# Patient Record
Sex: Male | Born: 1970 | Race: White | Hispanic: No | Marital: Single | Smoking: Current every day smoker
Health system: Southern US, Community
[De-identification: ages and names within clinical notes are randomized; demographics above are authoritative.]

## PROBLEM LIST (undated history)

## (undated) ENCOUNTER — Emergency Department (HOSPITAL_COMMUNITY): Admission: EM | Payer: Self-pay | Source: Home / Self Care

## (undated) DIAGNOSIS — I10 Essential (primary) hypertension: Secondary | ICD-10-CM

## (undated) HISTORY — PX: TEE WITHOUT CARDIOVERSION: SHX5443

---

## 2006-12-25 ENCOUNTER — Emergency Department: Payer: Self-pay | Admitting: Emergency Medicine

## 2010-02-15 ENCOUNTER — Emergency Department (HOSPITAL_COMMUNITY): Admission: EM | Admit: 2010-02-15 | Discharge: 2010-02-15 | Payer: Self-pay | Admitting: Emergency Medicine

## 2017-12-13 ENCOUNTER — Emergency Department (HOSPITAL_COMMUNITY)
Admission: EM | Admit: 2017-12-13 | Discharge: 2017-12-13 | Discharge status: Against Medical Advice | Payer: Self-pay | Attending: Emergency Medicine | Admitting: Emergency Medicine

## 2017-12-13 ENCOUNTER — Encounter (HOSPITAL_COMMUNITY): Payer: Self-pay

## 2017-12-13 ENCOUNTER — Other Ambulatory Visit: Payer: Self-pay

## 2017-12-13 ENCOUNTER — Emergency Department (HOSPITAL_COMMUNITY): Payer: Self-pay

## 2017-12-13 DIAGNOSIS — R0602 Shortness of breath: Secondary | ICD-10-CM | POA: Insufficient documentation

## 2017-12-13 DIAGNOSIS — Z5321 Procedure and treatment not carried out due to patient leaving prior to being seen by health care provider: Secondary | ICD-10-CM | POA: Insufficient documentation

## 2017-12-13 LAB — CBC
HCT: 48.8 % (ref 39.0–52.0)
Hemoglobin: 15.9 g/dL (ref 13.0–17.0)
MCH: 30.6 pg (ref 26.0–34.0)
MCHC: 32.6 g/dL (ref 30.0–36.0)
MCV: 93.8 fL (ref 78.0–100.0)
PLATELETS: 235 10*3/uL (ref 150–400)
RBC: 5.2 MIL/uL (ref 4.22–5.81)
RDW: 12.5 % (ref 11.5–15.5)
WBC: 8.4 10*3/uL (ref 4.0–10.5)

## 2017-12-13 LAB — BASIC METABOLIC PANEL
Anion gap: 12 (ref 5–15)
BUN: 20 mg/dL (ref 6–20)
CALCIUM: 9.1 mg/dL (ref 8.9–10.3)
CO2: 24 mmol/L (ref 22–32)
Chloride: 101 mmol/L (ref 98–111)
Creatinine, Ser: 1.06 mg/dL (ref 0.61–1.24)
GFR calc non Af Amer: >60 mL/min (ref >60)
Glucose, Bld: 119 mg/dL — ABNORMAL HIGH (ref 70–99)
Potassium: 4.1 mmol/L (ref 3.5–5.1)
Sodium: 137 mmol/L (ref 135–145)

## 2017-12-13 NOTE — ED Notes (Signed)
No answer in lobby.

## 2017-12-13 NOTE — ED Triage Notes (Signed)
Pt here for being short of breath, getting progressively worse over the last 3 days.  Stopped smoking 3 days ago as well. Productive cough for last 3 days.  Family hx of COPD.  States hard to take a deep breath.

## 2017-12-14 ENCOUNTER — Encounter (HOSPITAL_COMMUNITY): Payer: Self-pay | Admitting: Emergency Medicine

## 2017-12-14 ENCOUNTER — Other Ambulatory Visit: Payer: Self-pay

## 2017-12-14 ENCOUNTER — Emergency Department (HOSPITAL_COMMUNITY): Payer: Self-pay

## 2017-12-14 ENCOUNTER — Emergency Department (HOSPITAL_COMMUNITY)
Admission: EM | Admit: 2017-12-14 | Discharge: 2017-12-15 | Disposition: A | Discharge status: Home / Self Care | Payer: Self-pay | Attending: Emergency Medicine | Admitting: Emergency Medicine

## 2017-12-14 DIAGNOSIS — J209 Acute bronchitis, unspecified: Secondary | ICD-10-CM | POA: Insufficient documentation

## 2017-12-14 DIAGNOSIS — Z87891 Personal history of nicotine dependence: Secondary | ICD-10-CM | POA: Insufficient documentation

## 2017-12-14 LAB — CBC
HEMATOCRIT: 49.8 % (ref 39.0–52.0)
Hemoglobin: 16.4 g/dL (ref 13.0–17.0)
MCH: 30.5 pg (ref 26.0–34.0)
MCHC: 32.9 g/dL (ref 30.0–36.0)
MCV: 92.7 fL (ref 78.0–100.0)
PLATELETS: 237 10*3/uL (ref 150–400)
RBC: 5.37 MIL/uL (ref 4.22–5.81)
RDW: 12.5 % (ref 11.5–15.5)
WBC: 6.7 10*3/uL (ref 4.0–10.5)

## 2017-12-14 MED ORDER — ALBUTEROL SULFATE (2.5 MG/3ML) 0.083% IN NEBU
5.0000 mg | INHALATION_SOLUTION | Freq: Once | RESPIRATORY_TRACT | Status: AC
  Administered 2017-12-14: 5 mg via RESPIRATORY_TRACT
  Filled 2017-12-14: qty 6

## 2017-12-14 NOTE — ED Triage Notes (Signed)
Pt reports shortness of breath since last Friday. Pt was here on Monday but LWBS. Pt reports last night he passed out twice and hit the side of the face, no obvious injury.

## 2017-12-15 ENCOUNTER — Emergency Department (HOSPITAL_COMMUNITY): Payer: Self-pay

## 2017-12-15 LAB — BASIC METABOLIC PANEL
Anion gap: 10 (ref 5–15)
BUN: 21 mg/dL — ABNORMAL HIGH (ref 6–20)
CALCIUM: 9.1 mg/dL (ref 8.9–10.3)
CO2: 25 mmol/L (ref 22–32)
Chloride: 103 mmol/L (ref 98–111)
Creatinine, Ser: 1.21 mg/dL (ref 0.61–1.24)
GFR calc non Af Amer: >60 mL/min (ref >60)
Glucose, Bld: 133 mg/dL — ABNORMAL HIGH (ref 70–99)
Potassium: 4.4 mmol/L (ref 3.5–5.1)
SODIUM: 138 mmol/L (ref 135–145)

## 2017-12-15 LAB — URINALYSIS, ROUTINE W REFLEX MICROSCOPIC
BILIRUBIN URINE: NEGATIVE
Glucose, UA: NEGATIVE mg/dL
HGB URINE DIPSTICK: NEGATIVE
KETONES UR: NEGATIVE mg/dL
Leukocytes, UA: NEGATIVE
NITRITE: NEGATIVE
PH: 5 (ref 5.0–8.0)
Protein, ur: NEGATIVE mg/dL
Specific Gravity, Urine: 1.021 (ref 1.005–1.030)

## 2017-12-15 MED ORDER — IPRATROPIUM-ALBUTEROL 0.5-2.5 (3) MG/3ML IN SOLN
3.0000 mL | Freq: Once | RESPIRATORY_TRACT | Status: AC
  Administered 2017-12-15: 3 mL via RESPIRATORY_TRACT
  Filled 2017-12-15: qty 3

## 2017-12-15 MED ORDER — ALBUTEROL SULFATE HFA 108 (90 BASE) MCG/ACT IN AERS
2.0000 | INHALATION_SPRAY | RESPIRATORY_TRACT | Status: DC | PRN
  Administered 2017-12-15: 2 via RESPIRATORY_TRACT
  Filled 2017-12-15: qty 6.7

## 2017-12-15 MED ORDER — IOPAMIDOL (ISOVUE-370) INJECTION 76%
100.0000 mL | Freq: Once | INTRAVENOUS | Status: AC | PRN
  Administered 2017-12-15: 100 mL via INTRAVENOUS

## 2017-12-15 MED ORDER — PREDNISONE 20 MG PO TABS: 60.0000 mg | ORAL_TABLET | Freq: Every day | ORAL | 0 refills | Status: DC

## 2017-12-15 MED ORDER — IOPAMIDOL (ISOVUE-370) INJECTION 76%
INTRAVENOUS | Status: AC
  Filled 2017-12-15: qty 100

## 2017-12-15 MED ORDER — METHYLPREDNISOLONE SODIUM SUCC 125 MG IJ SOLR
125.0000 mg | Freq: Once | INTRAMUSCULAR | Status: AC
  Administered 2017-12-15: 125 mg via INTRAVENOUS
  Filled 2017-12-15: qty 2

## 2017-12-15 MED ORDER — AMOXICILLIN 500 MG PO CAPS: 1000.0000 mg | ORAL_CAPSULE | Freq: Two times a day (BID) | ORAL | 0 refills | Status: DC

## 2017-12-15 NOTE — ED Notes (Signed)
Patient transported to CT 

## 2017-12-15 NOTE — ED Notes (Signed)
Vss, pt verbalized understanding of dc instructions, resp e/u, nad.

## 2017-12-15 NOTE — ED Provider Notes (Signed)
[facility] EMERGENCY DEPARTMENT Provider Note   CSN: [id number] Arrival date & time: 12/14/17  2241     History   Chief Complaint Chief Complaint  Patient presents with  . Shortness of Breath  . Loss of Consciousness    HPI Alfred Michael is a 47 y.o. male.  Patient presents to the emergency department for evaluation of syncope and shortness of breath.  Patient reports that he has been experiencing shortness of breath for a week.  Symptoms have progressively worsened.  He reports that he quit smoking around that time.  He has been coughing excessively.  Today he blacked out twice.  He reports that with 1 of the episodes he actually fell to the ground.  No injury noted.  He reports that he still feels short of breath and it hurts when he breathes, cannot take a deep breath.     History reviewed. No pertinent past medical history.  There are no active problems to display for this patient.   History reviewed. No pertinent surgical history.      Home Medications    Prior to Admission medications   Medication Sig Start Date End Date Taking? Authorizing Provider  amoxicillin (AMOXIL) 500 MG capsule Take 2 capsules (1,000 mg total) by mouth 2 (two) times daily. 12/15/17   Gilda Crease, MD  predniSONE (DELTASONE) 20 MG tablet Take 3 tablets (60 mg total) by mouth daily with breakfast. 12/15/17   Ziana Heyliger, Canary Brim, MD    Family History History reviewed. No pertinent family history.  Social History Social History   Tobacco Use  . Smoking status: Former Smoker    Last attempt to quit: 12/11/2017    Years since quitting: 0.0  Substance Use Topics  . Alcohol use: Yes  . Drug use: Never     Allergies   Patient has no known allergies.   Review of Systems Review of Systems  Respiratory: Positive for cough and shortness of breath.   Neurological: Positive for syncope.  All other systems reviewed and are negative.    Physical  Exam Updated Vital Signs BP (!) 147/93   Pulse 81   Temp 97.9 F (36.6 C) (Oral)   Resp 20   SpO2 94%   Physical Exam  Constitutional: He is oriented to person, place, and time. He appears well-developed and well-nourished. No distress.  HENT:  Head: Normocephalic and atraumatic.  Right Ear: Hearing normal.  Left Ear: Hearing normal.  Nose: Nose normal.  Mouth/Throat: Oropharynx is clear and moist and mucous membranes are normal.  Eyes: Pupils are equal, round, and reactive to light. Conjunctivae and EOM are normal.  Neck: Normal range of motion. Neck supple.  Cardiovascular: Regular rhythm, S1 normal and S2 normal. Exam reveals no gallop and no friction rub.  No murmur heard. Pulmonary/Chest: Effort normal. No respiratory distress. He has decreased breath sounds. He has wheezes. He exhibits no tenderness.  Abdominal: Soft. Normal appearance and bowel sounds are normal. There is no hepatosplenomegaly. There is no tenderness. There is no rebound, no guarding, no tenderness at McBurney's point and negative Murphy's sign. No hernia.  Musculoskeletal: Normal range of motion.  Neurological: He is alert and oriented to person, place, and time. He has normal strength. No cranial nerve deficit or sensory deficit. Coordination normal. GCS eye subscore is 4. GCS verbal subscore is 5. GCS motor subscore is 6.  Skin: Skin is warm, dry and intact. No rash noted. No cyanosis.  Psychiatric: He has  a normal mood and affect. His speech is normal and behavior is normal. Thought content normal.  Nursing note and vitals reviewed.    ED Treatments / Results  Labs (all labs ordered are listed, but only abnormal results are displayed) Labs Reviewed  BASIC METABOLIC PANEL - Abnormal; Notable for the following components:      Result Value   Glucose, Bld 133 (*)    BUN 21 (*)    All other components within normal limits  CBC  URINALYSIS, ROUTINE W REFLEX MICROSCOPIC    EKG EKG  Interpretation  Date/Time:  Friday December 14 2017 22:55:22 EDT Ventricular Rate:  91 PR Interval:  120 QRS Duration: 88 QT Interval:  340 QTC Calculation: 418 R Axis:   88 Text Interpretation:  Normal sinus rhythm Normal ECG Confirmed by Gilda Crease ([id number]) on 12/15/2017 2:19:33 AM   Radiology Dg Chest 2 View  Result Date: 12/14/2017 CLINICAL DATA:  Shortness of breath EXAM: CHEST - 2 VIEW COMPARISON:  12/13/2017 FINDINGS: No focal airspace disease or pleural effusion. Normal heart size. No pneumothorax. IMPRESSION: No active cardiopulmonary disease. Electronically Signed   By: Jasmine Pang M.D.   On: 12/14/2017 23:49   Ct Angio Chest Pe W Or Wo Contrast  Result Date: 12/15/2017 CLINICAL DATA:  Shortness of breath. Syncope. PE suspected, high pretest prob EXAM: CT ANGIOGRAPHY CHEST WITH CONTRAST TECHNIQUE: Multidetector CT imaging of the chest was performed using the standard protocol during bolus administration of intravenous contrast. Multiplanar CT image reconstructions and MIPs were obtained to evaluate the vascular anatomy. CONTRAST:  ISOVUE-370 IOPAMIDOL (ISOVUE-370) INJECTION 76% COMPARISON:  Chest radiograph yesterday. FINDINGS: Cardiovascular: There are no filling defects within the pulmonary arteries to suggest pulmonary embolus. The thoracic aorta is normal in caliber without dissection. Heart is normal in size. No pericardial effusion. Mediastinum/Nodes: Shotty bilateral hilar nodes measuring 9 mm on the right and 8 mm on the left. No enlarged mediastinal nodes. Visualized thyroid gland is normal. Esophagus is nondistended. Lungs/Pleura: Moderate bronchial thickening with areas of mucous plugging in the right middle and bilateral lower lobes. Minimal dependent atelectasis. No confluent airspace disease, pulmonary edema or pleural fluid. No pulmonary nodule or mass. Upper Abdomen: No acute abnormality. Musculoskeletal: There are no acute or suspicious osseous  abnormalities. Multilevel degenerative change in the thoracic spine. Remote left lateral rib fractures. Review of the MIP images confirms the above findings. IMPRESSION: 1. No pulmonary embolus. 2. Central bronchial thickening with areas of mucous plugging in the right middle and bilateral lower lobes consistent with bronchitis or asthma. Shotty mediastinal lymph nodes are likely reactive. Electronically Signed   By: Narda Rutherford M.D.   On: 12/15/2017 04:18    Procedures Procedures (including critical care time)  Medications Ordered in ED Medications  iopamidol (ISOVUE-370) 76 % injection (has no administration in time range)  albuterol (PROVENTIL HFA;VENTOLIN HFA) 108 (90 Base) MCG/ACT inhaler 2 puff (has no administration in time range)  albuterol (PROVENTIL) (2.5 MG/3ML) 0.083% nebulizer solution 5 mg (5 mg Nebulization Given 12/14/17 2259)  methylPREDNISolone sodium succinate (SOLU-MEDROL) 125 mg/2 mL injection 125 mg (125 mg Intravenous Given 12/15/17 0258)  ipratropium-albuterol (DUONEB) 0.5-2.5 (3) MG/3ML nebulizer solution 3 mL (3 mLs Nebulization Given 12/15/17 0258)  iopamidol (ISOVUE-370) 76 % injection 100 mL (100 mLs Intravenous Contrast Given 12/15/17 0330)     Initial Impression / Assessment and Plan / ED Course  I have reviewed the triage vital signs and the nursing notes.  Pertinent labs & imaging  results that were available during my care of the patient were reviewed by me and considered in my medical decision making (see chart for details).     Patient presents to the emergency department for evaluation of cough, chest congestion.  He also has had a syncopal episode here today.  Patient reports that symptoms began after he quit smoking.  He has had cough and congestion.  Cough is productive of thick sputum.  He had some wheezing here in the ER, improved with nebulizer treatment.  CT angiography did not show evidence of PE or pneumonia, patient likely has significant  bronchospasm and bronchitis, will treat.  Final Clinical Impressions(s) / ED Diagnoses   Final diagnoses:  Acute bronchitis, unspecified organism    ED Discharge Orders         Ordered    amoxicillin (AMOXIL) 500 MG capsule  2 times daily     12/15/17 0634    predniSONE (DELTASONE) 20 MG tablet  Daily with breakfast     12/15/17 0634           Gilda CreasePollina, Christepher Melchior J, MD 12/15/17 [phone]

## 2019-05-25 IMAGING — CR DG CHEST 2V
2 series · 2 of 2 positions shown · non-contrast
Comparison: None.

CLINICAL DATA: Shortness of breath.

EXAM:
CHEST - 2 VIEW

[chest pa]
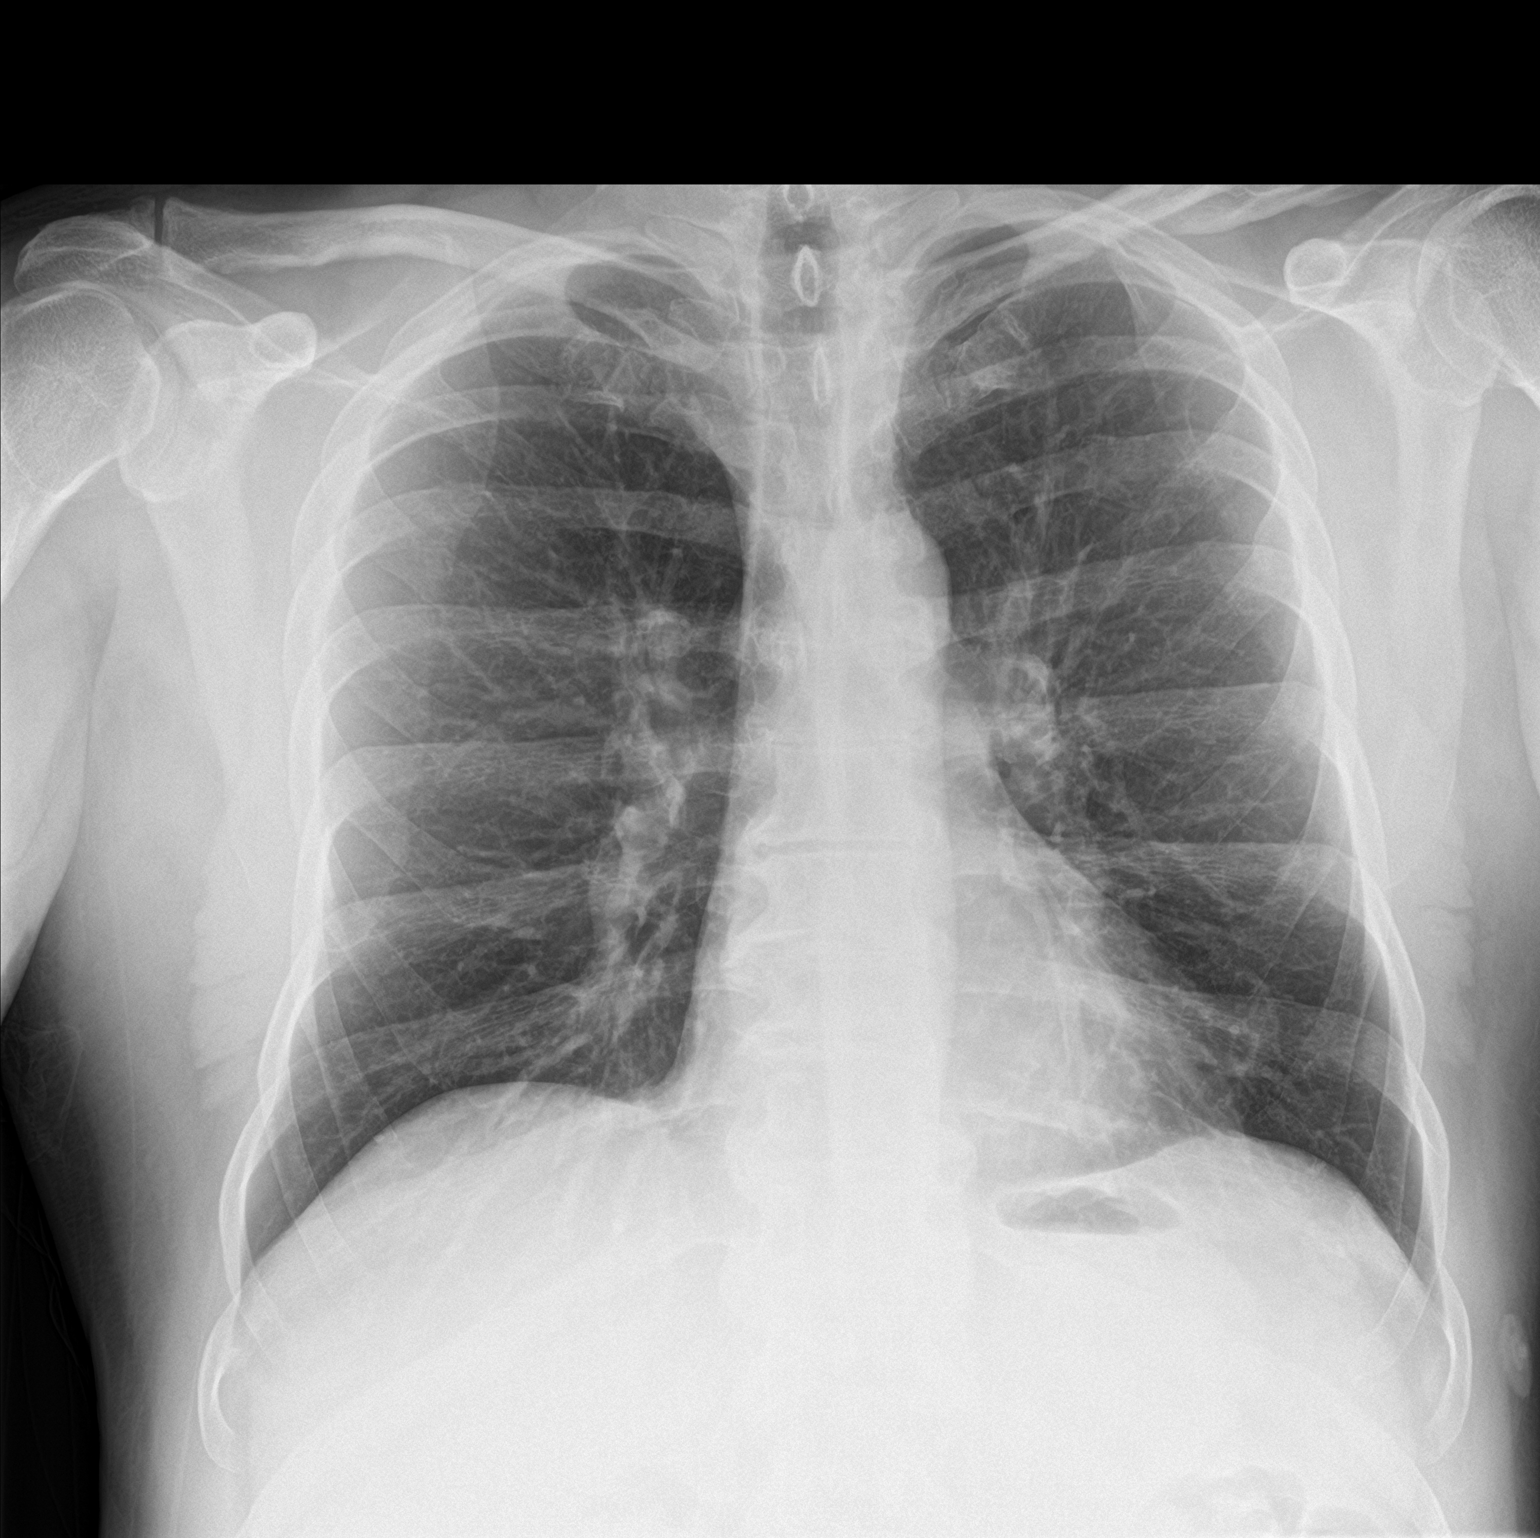

[chest lat]
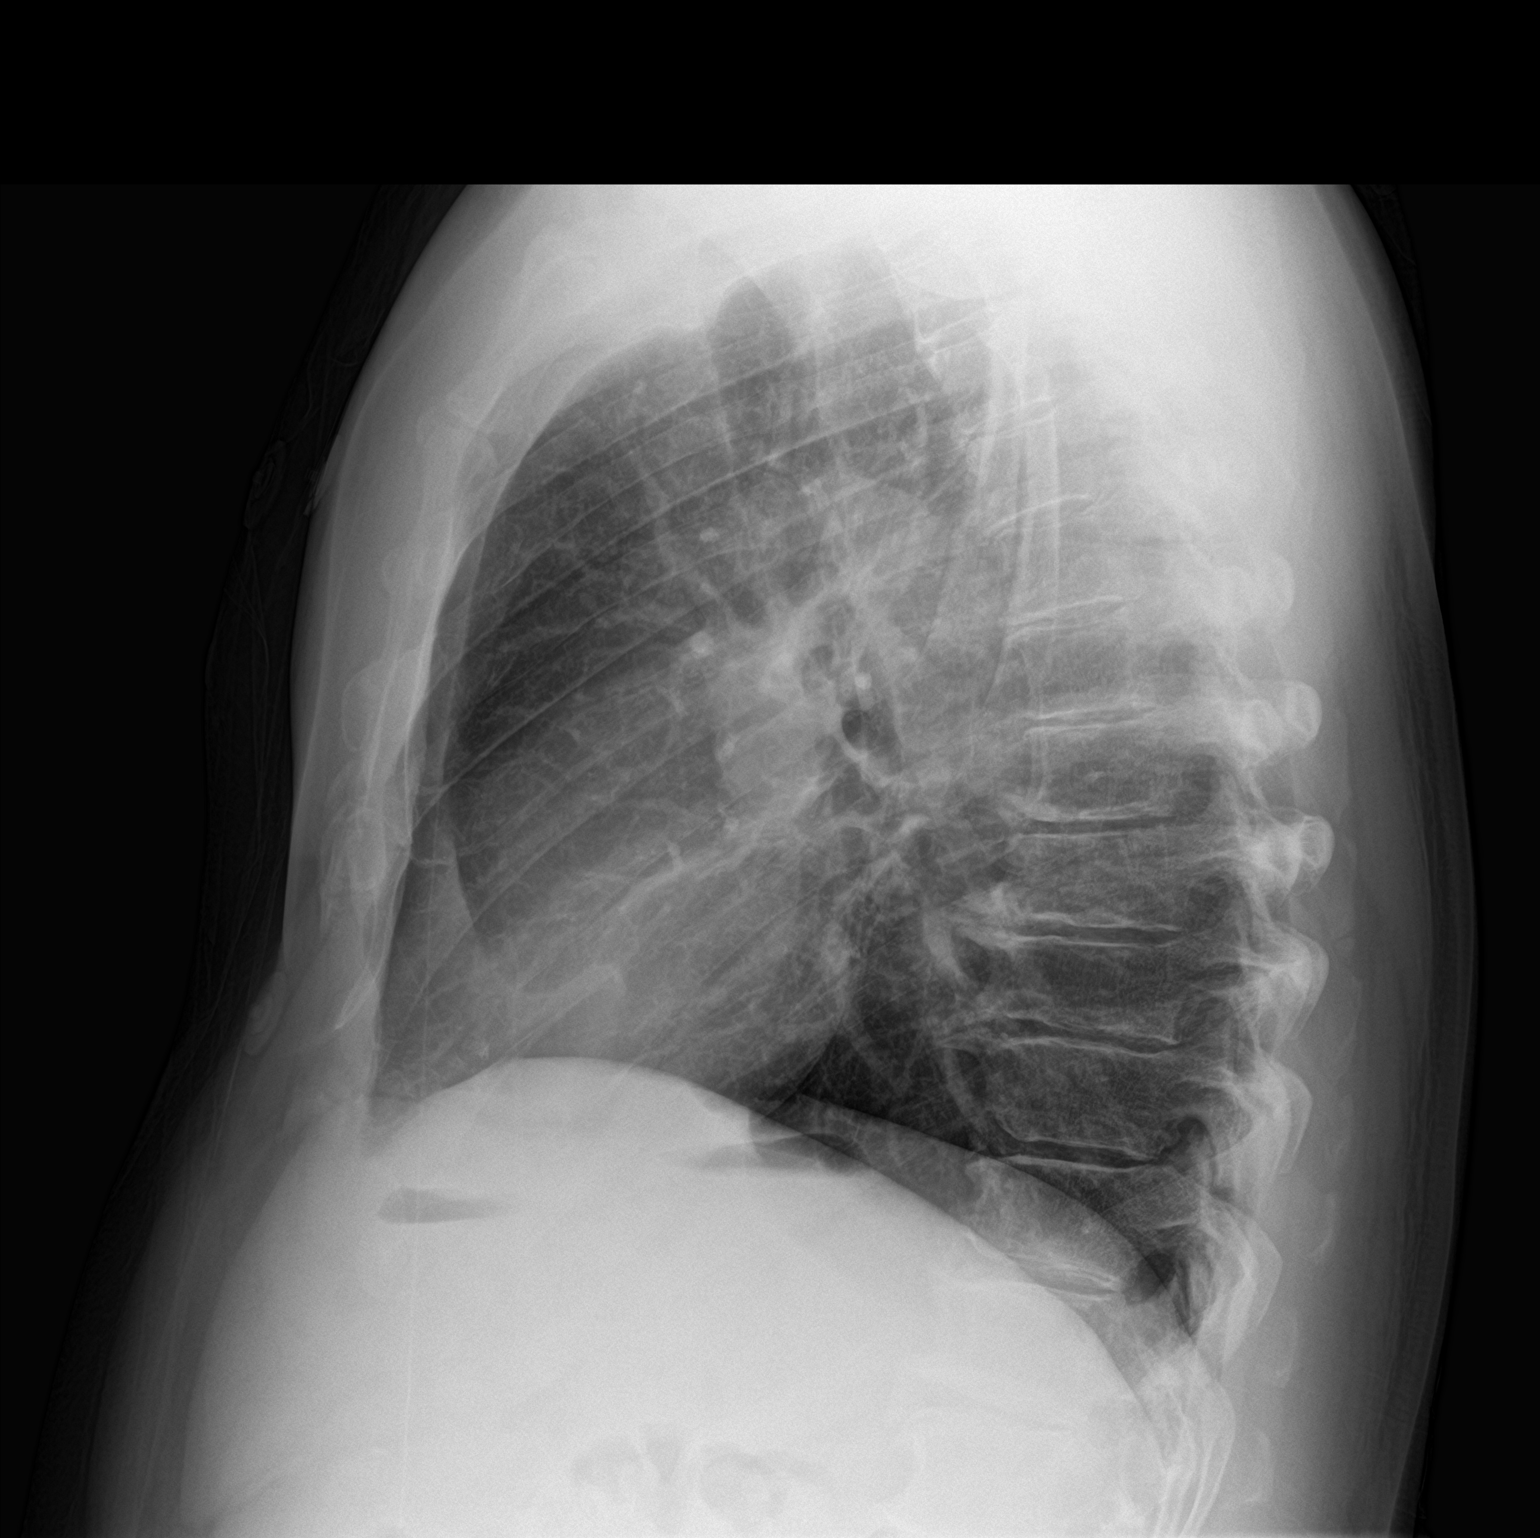

[2 of 2 positions shown; findings below may reference images not displayed]

FINDINGS: The cardiomediastinal contours are normal. The lungs are clear.
Pulmonary vasculature is normal. No consolidation, pleural effusion,
or pneumothorax. No acute osseous abnormalities are seen.
Degenerative change in the spine.
IMPRESSION: No acute findings.

## 2019-05-26 ENCOUNTER — Ambulatory Visit: Payer: Self-pay

## 2019-05-26 IMAGING — CR DG CHEST 2V
2 series · 2 of 2 positions shown · non-contrast
Comparison: 12/13/2017

CLINICAL DATA: Shortness of breath

EXAM:
CHEST - 2 VIEW

[chest pa]
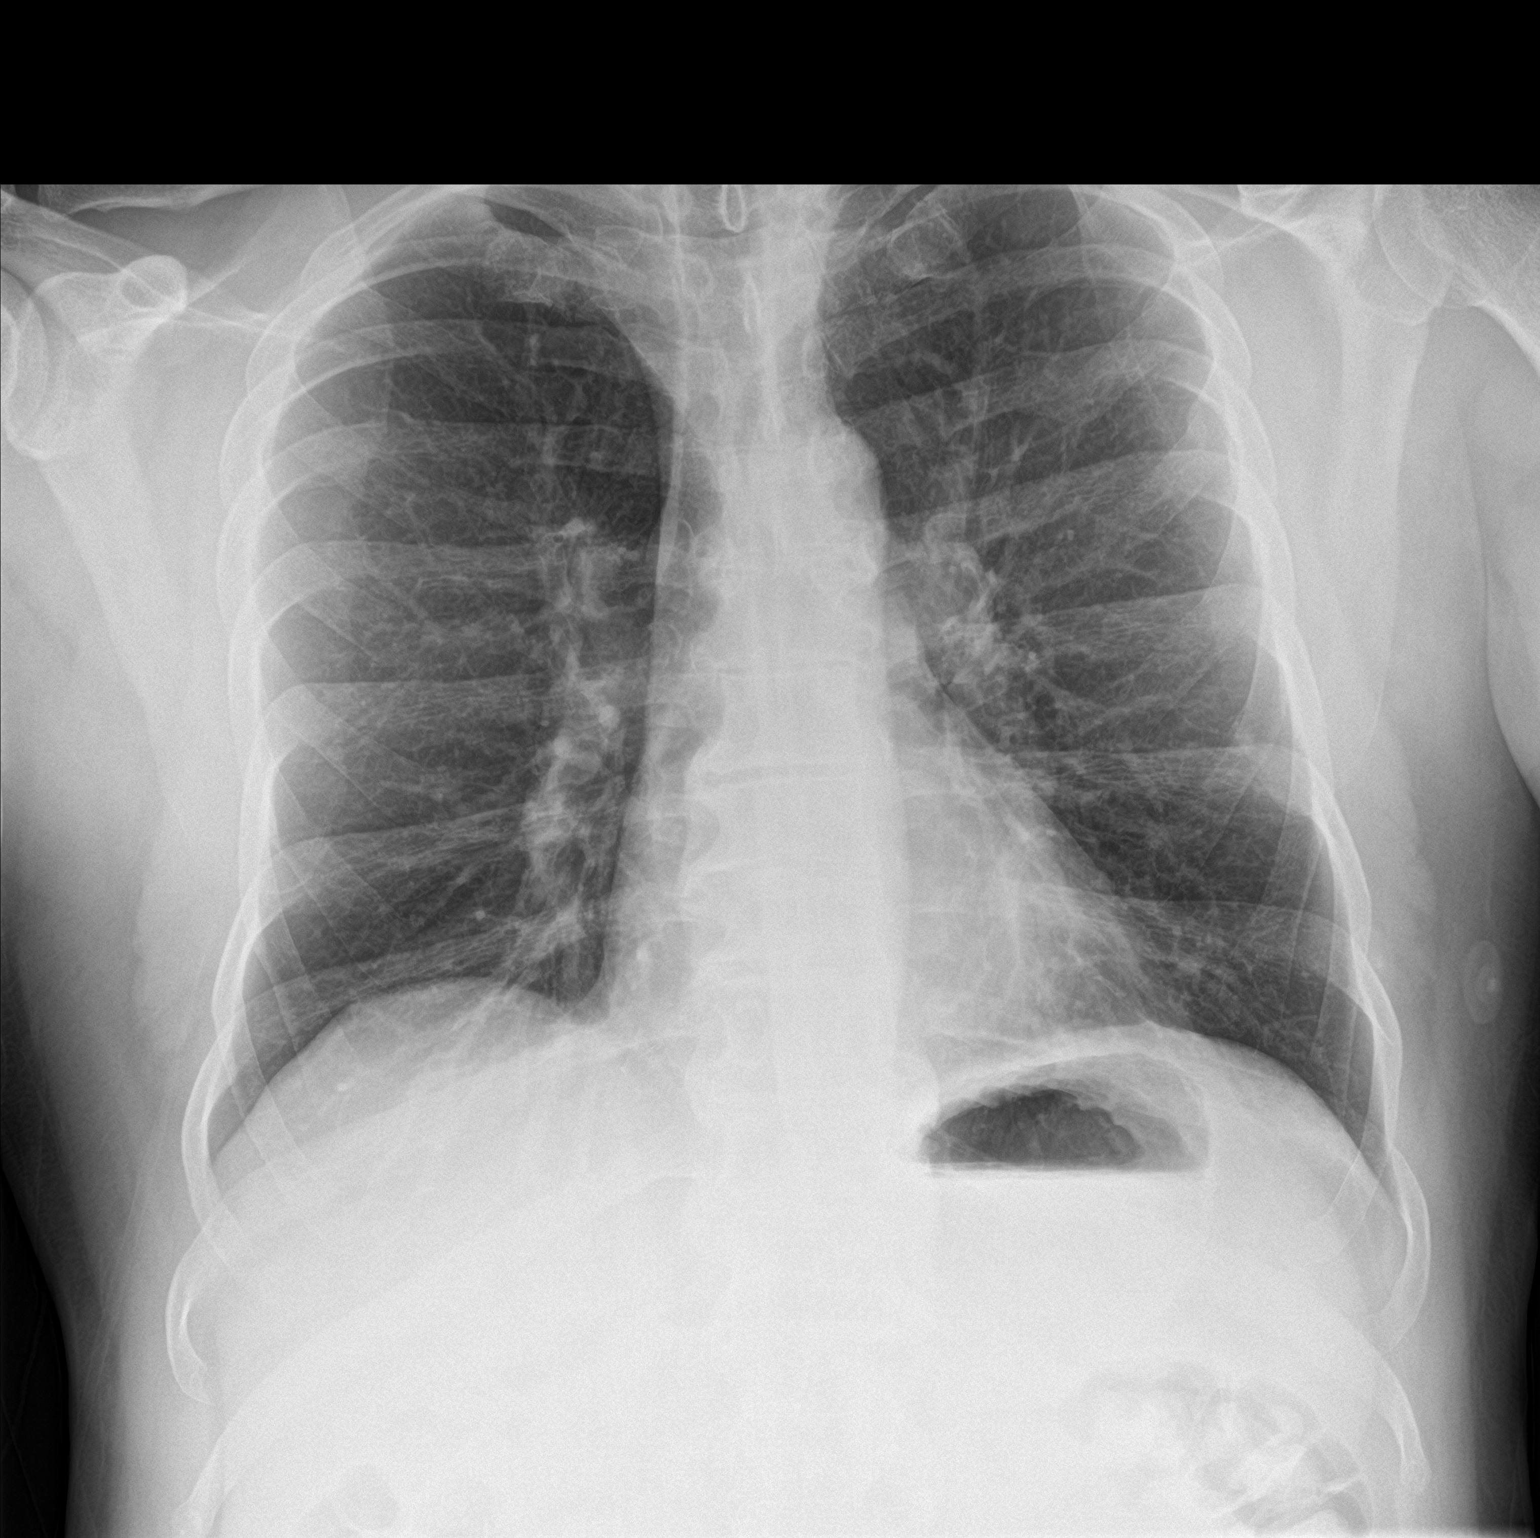

[chest lat]
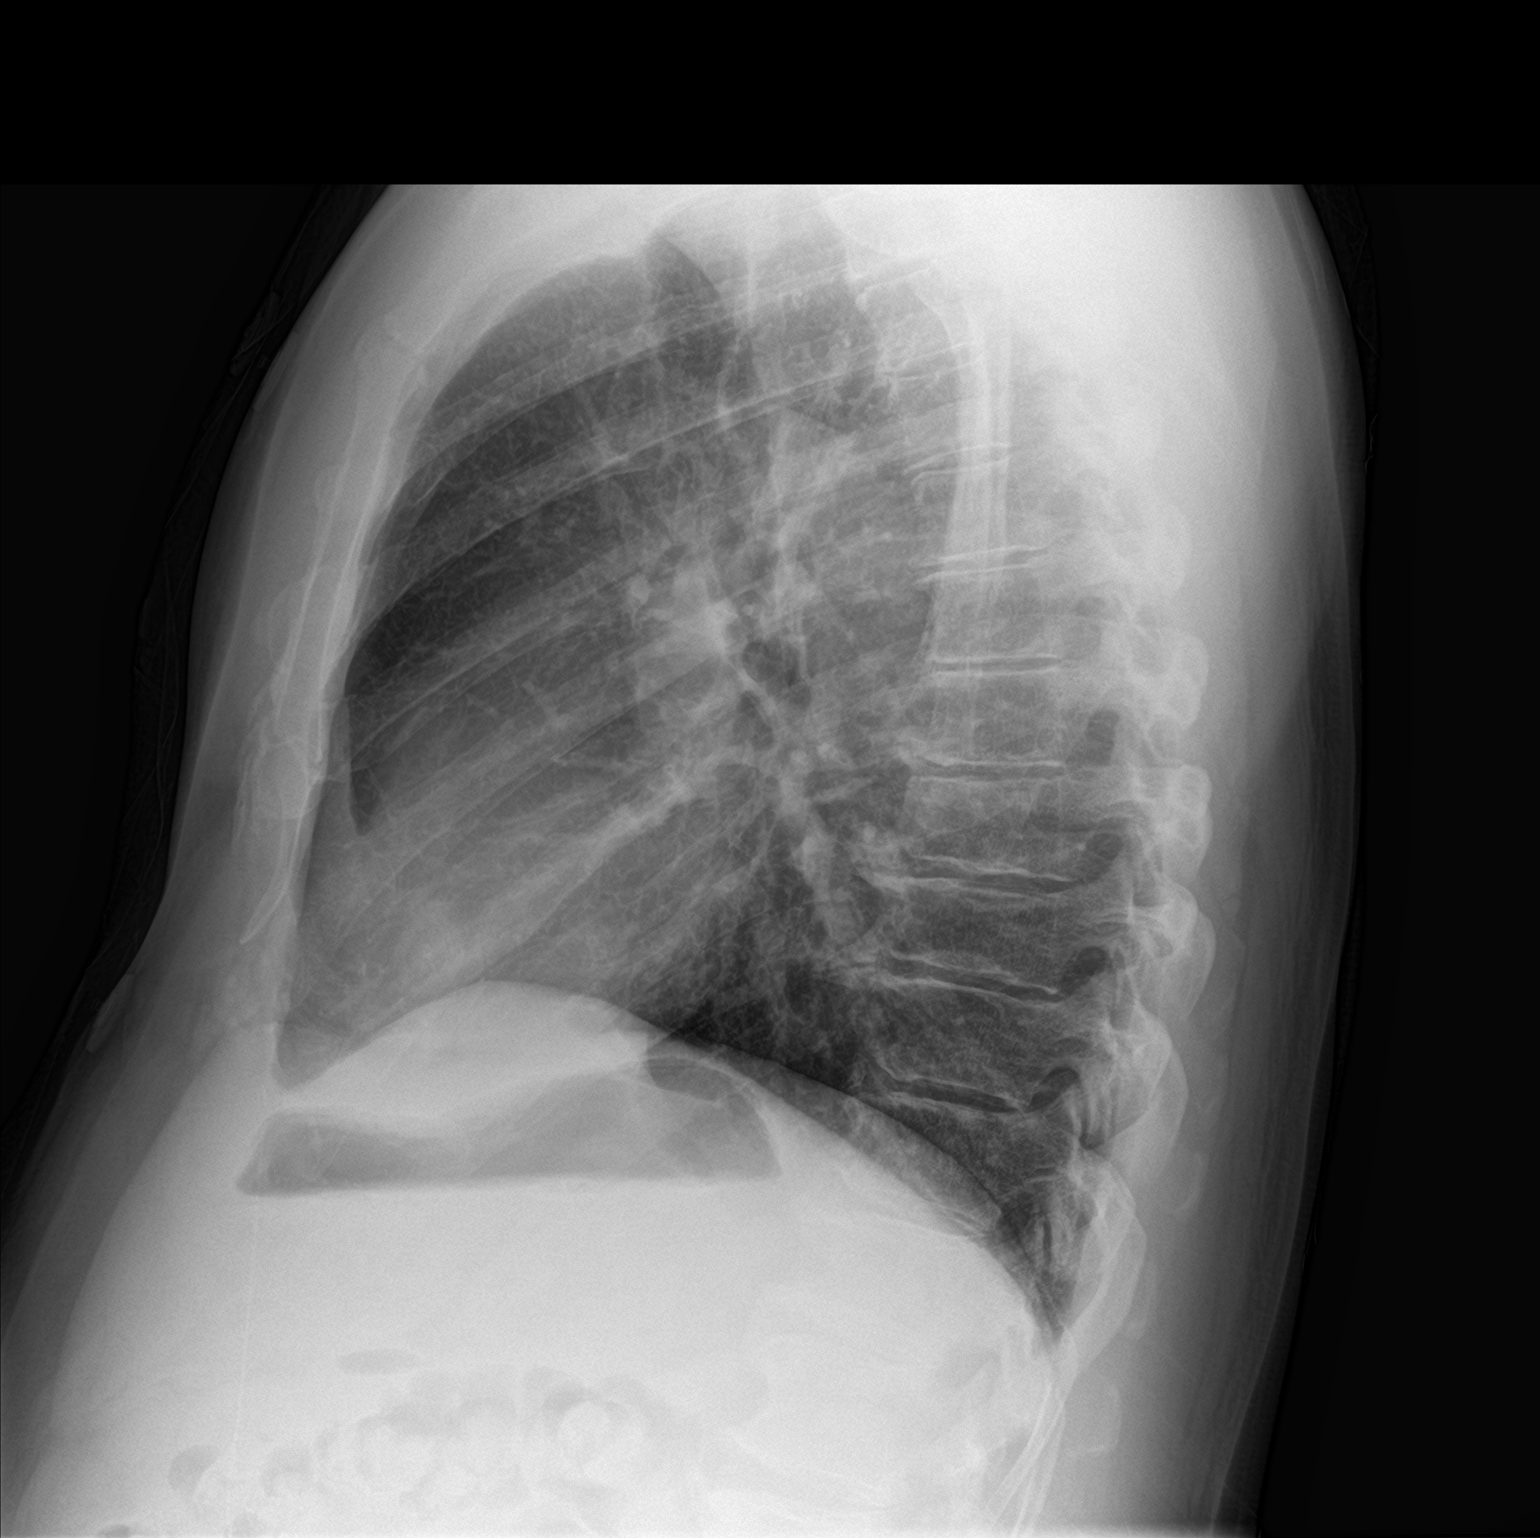

[2 of 2 positions shown; findings below may reference images not displayed]

FINDINGS: No focal airspace disease or pleural effusion. Normal heart size. No
pneumothorax.
IMPRESSION: No active cardiopulmonary disease.

## 2021-10-19 ENCOUNTER — Other Ambulatory Visit: Payer: Self-pay

## 2021-10-19 ENCOUNTER — Emergency Department (HOSPITAL_COMMUNITY)
Admission: EM | Admit: 2021-10-19 | Discharge: 2021-10-19 | Disposition: A | Discharge status: Home / Self Care | Payer: Self-pay | Attending: Emergency Medicine | Admitting: Emergency Medicine

## 2021-10-19 ENCOUNTER — Encounter (HOSPITAL_COMMUNITY): Payer: Self-pay | Admitting: Emergency Medicine

## 2021-10-19 DIAGNOSIS — L03116 Cellulitis of left lower limb: Secondary | ICD-10-CM | POA: Insufficient documentation

## 2021-10-19 DIAGNOSIS — L03115 Cellulitis of right lower limb: Secondary | ICD-10-CM | POA: Insufficient documentation

## 2021-10-19 DIAGNOSIS — L03119 Cellulitis of unspecified part of limb: Secondary | ICD-10-CM

## 2021-10-19 LAB — BASIC METABOLIC PANEL
Anion gap: 7 (ref 5–15)
BUN: 27 mg/dL — ABNORMAL HIGH (ref 6–20)
CO2: 31 mmol/L (ref 22–32)
Calcium: 8.7 mg/dL — ABNORMAL LOW (ref 8.9–10.3)
Chloride: 99 mmol/L (ref 98–111)
Creatinine, Ser: 1.1 mg/dL (ref 0.61–1.24)
GFR, Estimated: >60 mL/min (ref >60)
Glucose, Bld: 91 mg/dL (ref 70–99)
Potassium: 3.8 mmol/L (ref 3.5–5.1)
Sodium: 137 mmol/L (ref 135–145)

## 2021-10-19 LAB — CBC
HCT: 45.8 % (ref 39.0–52.0)
Hemoglobin: 14.9 g/dL (ref 13.0–17.0)
MCH: 29.7 pg (ref 26.0–34.0)
MCHC: 32.5 g/dL (ref 30.0–36.0)
MCV: 91.2 fL (ref 80.0–100.0)
Platelets: 217 10*3/uL (ref 150–400)
RBC: 5.02 MIL/uL (ref 4.22–5.81)
RDW: 12.9 % (ref 11.5–15.5)
WBC: 6.3 10*3/uL (ref 4.0–10.5)
nRBC: 0 % (ref 0.0–0.2)

## 2021-10-19 LAB — BRAIN NATRIURETIC PEPTIDE: B Natriuretic Peptide: 14 pg/mL (ref 0.0–100.0)

## 2021-10-19 MED ORDER — CEPHALEXIN 500 MG PO CAPS: 500.0000 mg | ORAL_CAPSULE | Freq: Four times a day (QID) | ORAL | 0 refills | Status: DC

## 2021-10-19 MED ORDER — CEPHALEXIN 500 MG PO CAPS
1000.0000 mg | ORAL_CAPSULE | Freq: Once | ORAL | Status: AC
  Administered 2021-10-19: 1000 mg via ORAL
  Filled 2021-10-19: qty 2

## 2021-10-19 NOTE — ED Notes (Signed)
Pharmacy tech went over patient medications.  Patient stated that he took one of his mothers fluid pills, but doesn't know what it was called or the dosage.

## 2021-10-19 NOTE — ED Triage Notes (Signed)
Pt c/o bilateral leg swelling for a few days. Pt denies any pain or sob.

## 2021-10-19 NOTE — ED Provider Notes (Addendum)
[facility] EMERGENCY DEPARTMENT Provider Note   CSN: [id number] Arrival date & time: 10/19/21  1840     History  Chief Complaint  Patient presents with   Leg Swelling    Alfred Michael is a 51 y.o. male with no documented medical history.  The patient presents to the ED for evaluation.  Patient states that for the last 2 days he has had bilateral lower extremity swelling.  Patient states that this issue is gotten progressively worse over the last 2 days.  The patient denies taking over-the-counter medications to alleviate symptoms.  Patient denies any shortness of breath, chest pain, orthopnea, fevers, nausea or vomiting.  Patient denies history of CHF.  HPI     Home Medications Prior to Admission medications   Medication Sig Start Date End Date Taking? Authorizing Provider  cephALEXin (KEFLEX) 500 MG capsule Take 1 capsule (500 mg total) by mouth 4 (four) times daily. 10/19/21  Yes Al Decant, PA-C      Allergies    Patient has no known allergies.    Review of Systems   Review of Systems  Constitutional:  Negative for fever.  Respiratory:  Negative for shortness of breath.   Cardiovascular:  Positive for leg swelling. Negative for chest pain.  Gastrointestinal:  Negative for nausea and vomiting.    Physical Exam Updated Vital Signs BP 131/82   Pulse 86   Resp 18   Ht 5\' 9"  (1.753 m)   Wt 102.5 kg   SpO2 96%   BMI 33.37 kg/m  Physical Exam Vitals and nursing note reviewed.  Constitutional:      General: He is not in acute distress.    Appearance: Normal appearance. He is not ill-appearing, toxic-appearing or diaphoretic.  HENT:     Head: Normocephalic and atraumatic.     Nose: Nose normal. No congestion.     Mouth/Throat:     Mouth: Mucous membranes are moist.     Pharynx: Oropharynx is clear.  Eyes:     Extraocular Movements: Extraocular movements intact.     Conjunctiva/sclera: Conjunctivae normal.     Pupils: Pupils are equal, round, and  reactive to light.  Cardiovascular:     Rate and Rhythm: Normal rate and regular rhythm.     Pulses:          Dorsalis pedis pulses are 2+ on the right side and 2+ on the left side.  Pulmonary:     Effort: Pulmonary effort is normal.     Breath sounds: Normal breath sounds. No wheezing.  Abdominal:     General: Abdomen is flat. Bowel sounds are normal.     Palpations: Abdomen is soft.     Tenderness: There is no abdominal tenderness.  Musculoskeletal:     Cervical back: Normal range of motion and neck supple. No tenderness.  Skin:    General: Skin is warm and dry.     Capillary Refill: Capillary refill takes less than 2 seconds.     Comments: Patient with bilateral erythema to anterior portion of lower extremities.  There is no edema.  Neurological:     Mental Status: He is alert and oriented to person, place, and time.     ED Results / Procedures / Treatments   Labs (all labs ordered are listed, but only abnormal results are displayed) Labs Reviewed  BASIC METABOLIC PANEL - Abnormal; Notable for the following components:      Result Value   BUN 27 (*)  Calcium 8.7 (*)    All other components within normal limits  CBC  BRAIN NATRIURETIC PEPTIDE    EKG None  Radiology No results found.  Procedures Procedures   Medications Ordered in ED Medications  cephALEXin (KEFLEX) capsule 1,000 mg (has no administration in time range)    ED Course/ Medical Decision Making/ A&P                           Medical Decision Making Amount and/or Complexity of Data Reviewed Labs: ordered.  Risk Prescription drug management.   51 year old male presents to the ED for evaluation.  Please see HPI for further details.  On examination, the patient is afebrile and nontachycardic.  The patient lung sounds are clear bilaterally, he is not hypoxic on room air.  The patient's abdomen is soft and compressible in all 4 quadrants.  The patient has overlying skin changes of erythema to  bilateral lower extremities, anteriorly.  This is concerning for cellulitis.  The patient has 2+ DP pulses to bilateral feet.  Patient worked up utilizing the following labs and imaging studies interpreted by me personally: - BNP unremarkable - BMP unremarkable - CBC unremarkable  Patient given first dose of Keflex here tonight.  The patient will be sent home on 10 days of Keflex 4 times daily.  Patient advised to follow-up with his PCP for further management.  The patient was given return precautions which she voiced understanding with.  The patient had all of his questions answered to his satisfaction.  The patient is stable at this time for discharge home  Final Clinical Impression(s) / ED Diagnoses Final diagnoses:  Cellulitis of lower extremity, unspecified laterality    Rx / DC Orders ED Discharge Orders          Ordered    cephALEXin (KEFLEX) 500 MG capsule  4 times daily        10/19/21 2243                  Al Decant, PA-C 10/19/21 2250    Derwood Kaplan, MD 10/20/21 1513

## 2021-10-19 NOTE — Discharge Instructions (Addendum)
Please return to the ED with any new symptoms such as fevers, nausea or vomiting Please begin taking antibiotics that I prescribed you.  You will take these antibiotics 4 times a day for the next 10 days.  You have received your first dose here tonight. Please follow-up with your PCP for further management of your cellulitis.  If you do not have a PCP, I have referred you to 1.  You will need to call and make an appointment to be seen.  The number is listed in this document. Please read the attached informational guide concerning cellulitis Please make sure that you are keeping your legs clean by practicing proper hygiene techniques

## 2021-10-20 ENCOUNTER — Encounter (HOSPITAL_COMMUNITY): Payer: Self-pay | Admitting: Emergency Medicine

## 2022-04-28 ENCOUNTER — Emergency Department (HOSPITAL_COMMUNITY): Payer: Medicaid Other

## 2022-04-28 ENCOUNTER — Other Ambulatory Visit: Payer: Self-pay

## 2022-04-28 ENCOUNTER — Inpatient Hospital Stay (HOSPITAL_COMMUNITY)
Admission: EM | Admit: 2022-04-28 | Discharge: 2022-04-30 | DRG: 552 | Discharge status: Against Medical Advice | Payer: Medicaid Other | Attending: Internal Medicine | Admitting: Internal Medicine

## 2022-04-28 ENCOUNTER — Encounter (HOSPITAL_COMMUNITY): Payer: Self-pay

## 2022-04-28 DIAGNOSIS — M4645 Discitis, unspecified, thoracolumbar region: Secondary | ICD-10-CM | POA: Diagnosis present

## 2022-04-28 DIAGNOSIS — L02818 Cutaneous abscess of other sites: Secondary | ICD-10-CM | POA: Diagnosis present

## 2022-04-28 DIAGNOSIS — R7881 Bacteremia: Secondary | ICD-10-CM | POA: Diagnosis present

## 2022-04-28 DIAGNOSIS — R0600 Dyspnea, unspecified: Secondary | ICD-10-CM | POA: Diagnosis present

## 2022-04-28 DIAGNOSIS — M4644 Discitis, unspecified, thoracic region: Secondary | ICD-10-CM | POA: Diagnosis not present

## 2022-04-28 DIAGNOSIS — B957 Other staphylococcus as the cause of diseases classified elsewhere: Secondary | ICD-10-CM | POA: Diagnosis present

## 2022-04-28 DIAGNOSIS — B9562 Methicillin resistant Staphylococcus aureus infection as the cause of diseases classified elsewhere: Secondary | ICD-10-CM | POA: Diagnosis present

## 2022-04-28 DIAGNOSIS — M4624 Osteomyelitis of vertebra, thoracic region: Secondary | ICD-10-CM | POA: Diagnosis present

## 2022-04-28 DIAGNOSIS — I10 Essential (primary) hypertension: Secondary | ICD-10-CM | POA: Diagnosis present

## 2022-04-28 DIAGNOSIS — Z713 Dietary counseling and surveillance: Secondary | ICD-10-CM | POA: Diagnosis not present

## 2022-04-28 DIAGNOSIS — R451 Restlessness and agitation: Secondary | ICD-10-CM | POA: Diagnosis not present

## 2022-04-28 DIAGNOSIS — Z716 Tobacco abuse counseling: Secondary | ICD-10-CM

## 2022-04-28 DIAGNOSIS — F1910 Other psychoactive substance abuse, uncomplicated: Secondary | ICD-10-CM | POA: Diagnosis present

## 2022-04-28 DIAGNOSIS — E871 Hypo-osmolality and hyponatremia: Secondary | ICD-10-CM | POA: Insufficient documentation

## 2022-04-28 DIAGNOSIS — F17210 Nicotine dependence, cigarettes, uncomplicated: Secondary | ICD-10-CM | POA: Diagnosis present

## 2022-04-28 LAB — CBC WITH DIFFERENTIAL/PLATELET
Abs Immature Granulocytes: 0.15 10*3/uL — ABNORMAL HIGH (ref 0.00–0.07)
Basophils Absolute: 0.1 10*3/uL (ref 0.0–0.1)
Basophils Relative: 0 %
Eosinophils Absolute: 0.2 10*3/uL (ref 0.0–0.5)
Eosinophils Relative: 1 %
HCT: 39.1 % (ref 39.0–52.0)
Hemoglobin: 13.1 g/dL (ref 13.0–17.0)
Immature Granulocytes: 1 %
Lymphocytes Relative: 6 %
Lymphs Abs: 0.8 10*3/uL (ref 0.7–4.0)
MCH: 29.4 pg (ref 26.0–34.0)
MCHC: 33.5 g/dL (ref 30.0–36.0)
MCV: 87.7 fL (ref 80.0–100.0)
Monocytes Absolute: 1 10*3/uL (ref 0.1–1.0)
Monocytes Relative: 8 %
Neutro Abs: 10.3 10*3/uL — ABNORMAL HIGH (ref 1.7–7.7)
Neutrophils Relative %: 84 %
Platelets: 280 10*3/uL (ref 150–400)
RBC: 4.46 MIL/uL (ref 4.22–5.81)
RDW: 12.7 % (ref 11.5–15.5)
WBC: 12.5 10*3/uL — ABNORMAL HIGH (ref 4.0–10.5)
nRBC: 0 % (ref 0.0–0.2)

## 2022-04-28 LAB — OSMOLALITY: Osmolality: 269 mOsm/kg — ABNORMAL LOW (ref 275–295)

## 2022-04-28 LAB — SODIUM, URINE, RANDOM: Sodium, Ur: <10 mmol/L

## 2022-04-28 LAB — RAPID URINE DRUG SCREEN, HOSP PERFORMED
Amphetamines: POSITIVE — AB
Barbiturates: NOT DETECTED
Benzodiazepines: NOT DETECTED
Cocaine: POSITIVE — AB
Opiates: POSITIVE — AB
Tetrahydrocannabinol: POSITIVE — AB

## 2022-04-28 LAB — URINALYSIS, ROUTINE W REFLEX MICROSCOPIC
Bilirubin Urine: NEGATIVE
Glucose, UA: NEGATIVE mg/dL
Ketones, ur: NEGATIVE mg/dL
Leukocytes,Ua: NEGATIVE
Nitrite: NEGATIVE
Protein, ur: 30 mg/dL — AB
Specific Gravity, Urine: 1.02 (ref 1.005–1.030)
pH: 6 (ref 5.0–8.0)

## 2022-04-28 LAB — BASIC METABOLIC PANEL
Anion gap: 10 (ref 5–15)
BUN: 26 mg/dL — ABNORMAL HIGH (ref 6–20)
CO2: 26 mmol/L (ref 22–32)
Calcium: 8.1 mg/dL — ABNORMAL LOW (ref 8.9–10.3)
Chloride: 89 mmol/L — ABNORMAL LOW (ref 98–111)
Creatinine, Ser: 1.08 mg/dL (ref 0.61–1.24)
GFR, Estimated: >60 mL/min (ref >60)
Glucose, Bld: 99 mg/dL (ref 70–99)
Potassium: 4.1 mmol/L (ref 3.5–5.1)
Sodium: 125 mmol/L — ABNORMAL LOW (ref 135–145)

## 2022-04-28 LAB — TSH: TSH: 0.887 u[IU]/mL (ref 0.350–4.500)

## 2022-04-28 LAB — LACTIC ACID, PLASMA: Lactic Acid, Venous: 0.9 mmol/L (ref 0.5–1.9)

## 2022-04-28 MED ORDER — MORPHINE SULFATE (PF) 4 MG/ML IV SOLN
4.0000 mg | Freq: Once | INTRAVENOUS | Status: AC
  Administered 2022-04-28: 4 mg via INTRAVENOUS
  Filled 2022-04-28: qty 1

## 2022-04-28 MED ORDER — OXYCODONE HCL 5 MG PO TABS
5.0000 mg | ORAL_TABLET | ORAL | Status: DC | PRN
  Administered 2022-04-28 – 2022-04-29 (×3): 5 mg via ORAL
  Filled 2022-04-28 (×3): qty 1

## 2022-04-28 MED ORDER — VANCOMYCIN HCL IN DEXTROSE 1-5 GM/200ML-% IV SOLN: 1000.0000 mg | Freq: Once | INTRAVENOUS | Status: DC

## 2022-04-28 MED ORDER — ONDANSETRON HCL 4 MG/2ML IJ SOLN: 4.0000 mg | Freq: Four times a day (QID) | INTRAMUSCULAR | Status: DC | PRN

## 2022-04-28 MED ORDER — ENALAPRILAT 1.25 MG/ML IV SOLN
0.6250 mg | Freq: Once | INTRAVENOUS | Status: AC
  Administered 2022-04-29: 0.625 mg via INTRAVENOUS
  Filled 2022-04-28: qty 0.5

## 2022-04-28 MED ORDER — KETOROLAC TROMETHAMINE 60 MG/2ML IM SOLN
60.0000 mg | Freq: Once | INTRAMUSCULAR | Status: AC
  Administered 2022-04-28: 60 mg via INTRAMUSCULAR
  Filled 2022-04-28: qty 2

## 2022-04-28 MED ORDER — SODIUM CHLORIDE 0.9 % IV SOLN: INTRAVENOUS | Status: DC

## 2022-04-28 MED ORDER — VANCOMYCIN HCL IN DEXTROSE 1-5 GM/200ML-% IV SOLN: 1000.0000 mg | Freq: Two times a day (BID) | INTRAVENOUS | Status: DC

## 2022-04-28 MED ORDER — ACETAMINOPHEN 650 MG RE SUPP: 650.0000 mg | Freq: Four times a day (QID) | RECTAL | Status: DC | PRN

## 2022-04-28 MED ORDER — OXYCODONE-ACETAMINOPHEN 5-325 MG PO TABS: 2.0000 | ORAL_TABLET | Freq: Once | ORAL | Status: DC

## 2022-04-28 MED ORDER — SODIUM CHLORIDE 0.9% FLUSH
3.0000 mL | Freq: Two times a day (BID) | INTRAVENOUS | Status: DC
  Administered 2022-04-30: 3 mL via INTRAVENOUS

## 2022-04-28 MED ORDER — SODIUM CHLORIDE 0.9 % IV SOLN: 250.0000 mL | INTRAVENOUS | Status: DC | PRN

## 2022-04-28 MED ORDER — ACETAMINOPHEN 325 MG PO TABS
650.0000 mg | ORAL_TABLET | Freq: Four times a day (QID) | ORAL | Status: DC | PRN
  Administered 2022-04-28: 650 mg via ORAL
  Filled 2022-04-28: qty 2

## 2022-04-28 MED ORDER — LABETALOL HCL 5 MG/ML IV SOLN: 10.0000 mg | INTRAVENOUS | Status: DC | PRN

## 2022-04-28 MED ORDER — SODIUM CHLORIDE 0.9% FLUSH: 3.0000 mL | INTRAVENOUS | Status: DC | PRN

## 2022-04-28 MED ORDER — SODIUM CHLORIDE 0.9 % IV SOLN: INTRAVENOUS | Status: AC

## 2022-04-28 MED ORDER — HYDROMORPHONE HCL 1 MG/ML IJ SOLN
0.5000 mg | INTRAMUSCULAR | Status: DC | PRN
  Administered 2022-04-28 – 2022-04-30 (×9): 1 mg via INTRAVENOUS
  Filled 2022-04-28 (×9): qty 1

## 2022-04-28 MED ORDER — ONDANSETRON HCL 4 MG PO TABS: 4.0000 mg | ORAL_TABLET | Freq: Four times a day (QID) | ORAL | Status: DC | PRN

## 2022-04-28 MED ORDER — VANCOMYCIN HCL IN DEXTROSE 1-5 GM/200ML-% IV SOLN
1000.0000 mg | Freq: Once | INTRAVENOUS | Status: AC
  Administered 2022-04-28: 1000 mg via INTRAVENOUS
  Filled 2022-04-28: qty 200

## 2022-04-28 NOTE — H&P (Signed)
History and Physical    MATTIE NOVOSEL ANV:[id number] DOB: 08-14-1970 DOA: 04/28/2022  PCP: [facility]   Patient coming from: Home  Chief Complaint: Back pain  HPI: Alfred Michael is a 52 y.o. male with medical history significant for polysubstance abuse and tobacco abuse who presented to the ED with complaints of back pain for approximately 1 week.  He was seen by an outside hospital because of his back pain and had a CT scan at that time demonstrating no acute intra-abdominal findings and UA with small amounts of blood and was treated for possible UTI.  He has not gotten any better despite treatment for his presumed UTI.  He denies any radiating pain to his lower extremities or weakness and is still able to ambulate.  He denies any issues with stool or urinary incontinence.  Denies any fevers or chills.  He specifically denies any IV drug abuse.   ED Course: Vital signs are stable and patient is afebrile.  Leukocytosis of 12,500 noted and sodium is 125.  MRI of the thoracolumbar spine with T10-11 discitis with osteomyelitis and developing phlegmon noted.  Case discussed with neurosurgery with no need for intervention surgically at this point.  He has been started empirically on IV vancomycin with blood cultures pending.  Review of Systems: Reviewed as noted above, otherwise negative.  History reviewed. No pertinent past medical history.  History reviewed. No pertinent surgical history.   reports that he has been smoking cigarettes. He does not have any smokeless tobacco history on file. He reports that he does not currently use alcohol. He reports that he does not use drugs.  No Known Allergies  History reviewed. No pertinent family history.  Prior to Admission medications   Medication Sig Start Date End Date Taking? Authorizing Provider  ibuprofen (ADVIL) 200 MG tablet Take 400 mg by mouth every 6 (six) hours as needed for mild pain or moderate pain.   Yes  [provider]  nitrofurantoin, macrocrystal-monohydrate, (MACROBID) 100 MG capsule Take 100 mg by mouth 2 (two) times daily. 04/17/22  Yes [provider]  cephALEXin (KEFLEX) 500 MG capsule Take 1 capsule (500 mg total) by mouth 4 (four) times daily. Patient not taking: Reported on 04/28/2022 10/19/21   Al Decant, PA-C    Physical Exam: Vitals:   04/28/22 0630 04/28/22 1003 04/28/22 1030 04/28/22 1100  BP: (!) 149/93 123/89 (!) 121/92 131/84  Pulse: (!) 111 95 96 (!) 101  Resp:  16  16  Temp:  98.4 F (36.9 C)    TempSrc:  Oral    SpO2: 93% 97% 97% 97%  Weight:      Height:        Constitutional: NAD, calm, comfortable Vitals:   04/28/22 0630 04/28/22 1003 04/28/22 1030 04/28/22 1100  BP: (!) 149/93 123/89 (!) 121/92 131/84  Pulse: (!) 111 95 96 (!) 101  Resp:  16  16  Temp:  98.4 F (36.9 C)    TempSrc:  Oral    SpO2: 93% 97% 97% 97%  Weight:      Height:       Eyes: lids and conjunctivae normal Neck: normal, supple Respiratory: clear to auscultation bilaterally. Normal respiratory effort. No accessory muscle use.  Cardiovascular: Regular rate and rhythm, no murmurs. Abdomen: no tenderness, no distention. Bowel sounds positive.  Musculoskeletal:  No edema. Skin: no rashes, lesions, ulcers.  Psychiatric: Flat affect  Labs on Admission: I have personally reviewed following labs and  imaging studies  CBC: Recent Labs  Lab 04/28/22 1005  WBC 12.5*  NEUTROABS 10.3*  HGB 13.1  HCT 39.1  MCV 87.7  PLT 280   Basic Metabolic Panel: Recent Labs  Lab 04/28/22 1005  NA 125*  K 4.1  CL 89*  CO2 26  GLUCOSE 99  BUN 26*  CREATININE 1.08  CALCIUM 8.1*   GFR: Estimated Creatinine Clearance: 102.9 mL/min (by C-G formula based on SCr of 1.08 mg/dL). Liver Function Tests: No results for input(s): "AST", "ALT", "ALKPHOS", "BILITOT", "PROT", "ALBUMIN" in the last 168 hours. No results for input(s): "LIPASE", "AMYLASE" in the last 168  hours. No results for input(s): "AMMONIA" in the last 168 hours. Coagulation Profile: No results for input(s): "INR", "PROTIME" in the last 168 hours. Cardiac Enzymes: No results for input(s): "CKTOTAL", "CKMB", "CKMBINDEX", "TROPONINI" in the last 168 hours. BNP (last 3 results) No results for input(s): "PROBNP" in the last 8760 hours. HbA1C: No results for input(s): "HGBA1C" in the last 72 hours. CBG: No results for input(s): "GLUCAP" in the last 168 hours. Lipid Profile: No results for input(s): "CHOL", "HDL", "LDLCALC", "TRIG", "CHOLHDL", "LDLDIRECT" in the last 72 hours. Thyroid Function Tests: No results for input(s): "TSH", "T4TOTAL", "FREET4", "T3FREE", "THYROIDAB" in the last 72 hours. Anemia Panel: No results for input(s): "VITAMINB12", "FOLATE", "FERRITIN", "TIBC", "IRON", "RETICCTPCT" in the last 72 hours. Urine analysis:    Component Value Date/Time   COLORURINE AMBER (A) 04/28/2022 0646   APPEARANCEUR CLEAR 04/28/2022 0646   LABSPEC 1.020 04/28/2022 0646   PHURINE 6.0 04/28/2022 0646   GLUCOSEU NEGATIVE 04/28/2022 0646   HGBUR MODERATE (A) 04/28/2022 0646   BILIRUBINUR NEGATIVE 04/28/2022 0646   KETONESUR NEGATIVE 04/28/2022 0646   PROTEINUR 30 (A) 04/28/2022 0646   NITRITE NEGATIVE 04/28/2022 0646   LEUKOCYTESUR NEGATIVE 04/28/2022 0646    Radiological Exams on Admission: MR THORACIC SPINE WO CONTRAST  Addendum Date: 04/28/2022   ADDENDUM REPORT: 04/28/2022 09:41 ADDENDUM: Study discussed by telephone with Dr. Eber Hong on 04/28/2022 at 09:37 . Electronically Signed   By: Odessa Fleming M.D.   On: 04/28/2022 09:41   Result Date: 04/28/2022 CLINICAL DATA:  52 year old male with back pain and drug use. EXAM: MRI THORACIC SPINE WITHOUT CONTRAST TECHNIQUE: Multiplanar, multisequence MR imaging of the thoracic spine was performed. No intravenous contrast was administered. COMPARISON:  CTA chest 12/15/2017. FINDINGS: Limited cervical spine imaging: Nonspecific heterogeneous  marrow signal in the lower cervical spine, could be degenerative. Straightening of cervical lordosis. Thoracic spine segmentation: Appears to be normal today and on the prior CTA chest. Alignment: Stable thoracic kyphosis since 2019. No significant spondylolisthesis. Vertebrae: Signal within the disc space is abnormal at T10-T11. There is subtle abnormal T1 and STIR signal in the adjacent vertebral bodies. And there is more conspicuous abnormal epidural material posterior to T10 and surrounding the left facet at that level. See series 22, image 32 which demonstrates subsequent spinal stenosis and mild spinal cord mass effect. Possible mild cord edema there, although the degree of cord compression is mild. There is no overt facet joint fluid or facet marrow edema. However, there does seem to be developing paraspinal phlegmon there on the left, series 21, image 19) and possible developing paraspinal abscess on series 21, image 2. Superimposed degenerative endplate changes at other lower thoracic levels. Background bone marrow signal is within normal limits. No other convincing marrow edema. Cord: Normal above the T10 level. Subtle prominence of the central spinal CSF canal suspected. Below the T10-T11 disc  space the cord signal and morphology also seems to normalize toward the conus which is incompletely visible at L1. Paraspinal and other soft tissues: Abnormal left lateral paraspinal, left costovertebral junction region soft tissues at T10-T11 as above. Right paraspinal soft tissues appear negative. Visible mediastinum and upper abdominal viscera appear negative. There is nonspecific streaky bilateral lower lung opacity greater on the left. No pleural effusion. Disc levels: Ordinary disc degeneration outside of the abnormal T10-T11 level, which does not result in degenerative spinal stenosis. IMPRESSION: 1. Abnormal T10-T11 disc, epidural space, and left paraspinal soft tissues Highly Suspicious For Developing  Discitis Osteomyelitis, Epidural And Paraspinal Phlegmon. There may not be a drainable fluid collection at this time, but the epidural involvement results in Spinal Stenosis With Mild Cord Compression and possible mild cord edema there. No pathologic fracture. 2. Thoracic spine degeneration elsewhere not resulting in spinal stenosis. Lumbar MRI reported separately. Electronically Signed: By: Genevie Ann M.D. On: 04/28/2022 09:30   MR LUMBAR SPINE WO CONTRAST  Result Date: 04/28/2022 CLINICAL DATA:  52 year old male with back pain and drug use. EXAM: MRI LUMBAR SPINE WITHOUT CONTRAST TECHNIQUE: Multiplanar, multisequence MR imaging of the lumbar spine was performed. No intravenous contrast was administered. COMPARISON:  CTA chest 12/15/2017. Thoracic MRI  Today. FINDINGS: Segmentation:  Normal, concordant with the thoracic numbering today. Alignment: Mild straightening of lumbar lordosis. Mild degenerative appearing retrolisthesis of L5 on S1. No significant scoliosis. Vertebrae: Widespread degenerative endplate spurring. Background bone marrow signal within normal limits. There does appear to be faint endplate edema at G4-W1 on the left but this seems to be degenerative in nature. No other convincing marrow edema or evidence of acute osseous abnormality. Intact visible sacrum and SI joints. Conus medullaris and cauda equina: Conus extends to the L1 level. Visible lower thoracic spinal cord and conus signal is normal, the abnormal T10-T11 level is not included on these images (see thoracic MRI). Paraspinal and other soft tissues: Negative. Disc levels: Age advanced although ordinary lumbar spine degeneration resulting in multifactorial mild spinal stenosis L1-L2 through L3-L4, moderate to severe degenerative spinal stenosis at L4-L5 and L5-S1. Associated moderate to severe degenerative neural foraminal stenosis at the bilateral L4 and L5 nerve levels. IMPRESSION: 1. No convincing acute or inflammatory process  identified in the lumbar spine. See abnormal Thoracic MRI today reported separately. 2. Widespread degenerative lumbar spinal stenosis, moderate to severe at both L4-L5 and L5-S1, and mild elsewhere. Moderate to severe L4 and L5 neural foraminal stenosis. Study discussed by telephone with Dr. Noemi Chapel on 04/28/2022 at 09:37 . Electronically Signed   By: Genevie Ann M.D.   On: 04/28/2022 09:37   DG Eye Foreign Body  Result Date: 04/28/2022 CLINICAL DATA:  Metal working/exposure; clearance prior to MRI EXAM: ORBITS FOR FOREIGN BODY - 2 VIEW COMPARISON:  None Available. FINDINGS: There is no evidence of metallic foreign body within the orbits. No significant bone abnormality identified. IMPRESSION: No evidence of metallic foreign body within the orbits. Electronically Signed   By: Emmit Alexanders M.D.   On: 04/28/2022 08:18     Assessment/Plan Principal Problem:   Discitis of thoracolumbar region Active Problems:   Hyponatremia   Polysubstance abuse (HCC)    Discitis/osteomyelitis/phlegmon of T10-11 Transfer to [facility] for ID evaluation, may require IR for aspiration depending on ID recommendations Blood cultures ordered and pending Continue vancomycin empirically EDP discussed with neurosurgery with no operative indication at this time Pain management  Hyponatremia Denies alcohol abuse Appears euvolemic Trial of  gentle IV normal saline, time-limited Check TSH, urine and serum osmolarity and urine sodium Follow labs  Polysubstance abuse Counseled on cessation, will need TOC consultation for  Tobacco abuse Counseled on cessation   DVT prophylaxis: SCDs Code Status: Full Family Communication:None at bedside  Disposition Plan:Transfer to [facility] for ID evaluation Consults called:ID, NS Admission status: Inpatient, Med/Surg  Severity of Illness: The appropriate patient status for this patient is INPATIENT. Inpatient status is judged to be reasonable and necessary in order to provide  the required intensity of service to ensure the patient's safety. The patient's presenting symptoms, physical exam findings, and initial radiographic and laboratory data in the context of their chronic comorbidities is felt to place them at high risk for further clinical deterioration. Furthermore, it is not anticipated that the patient will be medically stable for discharge from the hospital within 2 midnights of admission.   * I certify that at the point of admission it is my clinical judgment that the patient will require inpatient hospital care spanning beyond 2 midnights from the point of admission due to high intensity of service, high risk for further deterioration and high frequency of surveillance required.*   Chizaram Latino D Kier Smead DO [facility]  If 7PM-7AM, please contact night-coverage [email or web address]  04/28/2022, 11:29 AM

## 2022-04-28 NOTE — ED Notes (Signed)
ED TO INPATIENT HANDOFF REPORT  ED Nurse Name and Phone #:  Joellen Jersey [phone]  S Name/Age/Gender Alfred Michael 52 y.o. male Room/Bed: APA16A/APA16A  Code Status   Code Status: Not on file  Home/SNF/Other Home Patient oriented to: self, place, time, and situation Is this baseline? Yes   Triage Complete: Triage complete  Chief Complaint Discitis of thoracolumbar region [M46.45]  Triage Note Pt complaining of pain in the lower back that started last week. Was given an antibiotic for a UTI and the back has not stopped hurting.   Allergies No Known Allergies  Level of Care/Admitting Diagnosis ED Disposition     ED Disposition  Admit   Condition  --   Comment  Hospital Area: [facility] [[id number]]  Level of Care: Med-Surg [16]  May admit patient to [facility] or [facility] if equivalent level of care is available:: No  Covid Evaluation: Asymptomatic - no recent exposure (last 10 days) testing not required  Diagnosis: Discitis of thoracolumbar region [phone]  Admitting Physician: Rodena Goldmann [[id number]]  Attending Physician: Heath Lark D [[id number]]  Certification:: I certify this patient will need inpatient services for at least 2 midnights  Estimated Length of Stay: 4          B Medical/Surgery History History reviewed. No pertinent past medical history. History reviewed. No pertinent surgical history.   A IV Location/Drains/Wounds Patient Lines/Drains/Airways Status     Active Line/Drains/Airways     Name Placement date Placement time Site Days   Peripheral IV 04/28/22 20 G 1" Posterior;Right Hand 04/28/22  1015  Hand  less than 1            Intake/Output Last 24 hours  Intake/Output Summary (Last 24 hours) at 04/28/2022 1140 Last data filed at 04/28/2022 0719 Gross per 24 hour  Intake --  Output 300 ml  Net -300 ml    Labs/Imaging Results for orders placed or performed during the hospital encounter of 04/28/22 (from the  past 48 hour(s))  Urinalysis, Routine w reflex microscopic -Urine, Clean Catch     Status: Abnormal   Collection Time: 04/28/22  6:46 AM  Result Value Ref Range   Color, Urine AMBER (A) YELLOW    Comment: BIOCHEMICALS MAY BE AFFECTED BY COLOR   APPearance CLEAR CLEAR   Specific Gravity, Urine 1.020 1.005 - 1.030   pH 6.0 5.0 - 8.0   Glucose, UA NEGATIVE NEGATIVE mg/dL   Hgb urine dipstick MODERATE (A) NEGATIVE   Bilirubin Urine NEGATIVE NEGATIVE   Ketones, ur NEGATIVE NEGATIVE mg/dL   Protein, ur 30 (A) NEGATIVE mg/dL   Nitrite NEGATIVE NEGATIVE   Leukocytes,Ua NEGATIVE NEGATIVE   RBC / HPF 11-20 0 - 5 RBC/hpf   WBC, UA 0-5 0 - 5 WBC/hpf   Bacteria, UA RARE (A) NONE SEEN   Squamous Epithelial / HPF 0-5 0 - 5 /HPF    Comment: Performed at [facility], [address], [place], [place] [address]  Rapid urine drug screen (hospital performed)     Status: Abnormal   Collection Time: 04/28/22  7:19 AM  Result Value Ref Range   Opiates POSITIVE (A) NONE DETECTED   Cocaine POSITIVE (A) NONE DETECTED   Benzodiazepines NONE DETECTED NONE DETECTED   Amphetamines POSITIVE (A) NONE DETECTED   Tetrahydrocannabinol POSITIVE (A) NONE DETECTED   Barbiturates NONE DETECTED NONE DETECTED    Comment: (NOTE) DRUG SCREEN FOR MEDICAL PURPOSES ONLY.  IF CONFIRMATION IS NEEDED FOR ANY PURPOSE, NOTIFY  LAB WITHIN 5 DAYS.  LOWEST DETECTABLE LIMITS FOR URINE DRUG SCREEN Drug Class                     Cutoff (ng/mL) Amphetamine and metabolites    1000 Barbiturate and metabolites    200 Benzodiazepine                 200 Opiates and metabolites        300 Cocaine and metabolites        300 THC                            50 Performed at [facility], [address] [place], [place] [address]   Lactic acid, plasma     Status: None   Collection Time: 04/28/22 10:05 AM  Result Value Ref Range   Lactic Acid, Venous 0.9 0.5 - 1.9 mmol/L    Comment: Performed at [facility], [address]  [place], [place] [address]  CBC with Differential     Status: Abnormal   Collection Time: 04/28/22 10:05 AM  Result Value Ref Range   WBC 12.5 (H) 4.0 - 10.5 K/uL   RBC 4.46 4.22 - 5.81 MIL/uL   Hemoglobin 13.1 13.0 - 17.0 g/dL   HCT 85.6 31.4 - 97.0 %   MCV 87.7 80.0 - 100.0 fL   MCH 29.4 26.0 - 34.0 pg   MCHC 33.5 30.0 - 36.0 g/dL   RDW 26.3 78.5 - 88.5 %   Platelets 280 150 - 400 K/uL   nRBC 0.0 0.0 - 0.2 %   Neutrophils Relative % 84 %   Neutro Abs 10.3 (H) 1.7 - 7.7 K/uL   Lymphocytes Relative 6 %   Lymphs Abs 0.8 0.7 - 4.0 K/uL   Monocytes Relative 8 %   Monocytes Absolute 1.0 0.1 - 1.0 K/uL   Eosinophils Relative 1 %   Eosinophils Absolute 0.2 0.0 - 0.5 K/uL   Basophils Relative 0 %   Basophils Absolute 0.1 0.0 - 0.1 K/uL   Immature Granulocytes 1 %   Abs Immature Granulocytes 0.15 (H) 0.00 - 0.07 K/uL    Comment: Performed at [facility], [address], [place], [place] [address]  Basic metabolic panel     Status: Abnormal   Collection Time: 04/28/22 10:05 AM  Result Value Ref Range   Sodium 125 (L) 135 - 145 mmol/L   Potassium 4.1 3.5 - 5.1 mmol/L   Chloride 89 (L) 98 - 111 mmol/L   CO2 26 22 - 32 mmol/L   Glucose, Bld 99 70 - 99 mg/dL    Comment: Glucose reference range applies only to samples taken after fasting for at least 8 hours.   BUN 26 (H) 6 - 20 mg/dL   Creatinine, Ser 1.28 0.61 - 1.24 mg/dL   Calcium 8.1 (L) 8.9 - 10.3 mg/dL   GFR, Estimated >78 >67 mL/min    Comment: (NOTE) Calculated using the CKD-EPI Creatinine Equation (2021)    Anion gap 10 5 - 15    Comment: Performed at [facility], [address], [place], [place] [address]   MR THORACIC SPINE WO CONTRAST  Addendum Date: 04/28/2022   ADDENDUM REPORT: 04/28/2022 09:41 ADDENDUM: Study discussed by telephone with Dr. Eber Hong on 04/28/2022 at 09:37 . Electronically Signed   By: Odessa Fleming M.D.   On: 04/28/2022 09:41   Result Date: 04/28/2022 CLINICAL DATA:  52 year old male  with back pain and drug  use. EXAM: MRI THORACIC SPINE WITHOUT CONTRAST TECHNIQUE: Multiplanar, multisequence MR imaging of the thoracic spine was performed. No intravenous contrast was administered. COMPARISON:  CTA chest 12/15/2017. FINDINGS: Limited cervical spine imaging: Nonspecific heterogeneous marrow signal in the lower cervical spine, could be degenerative. Straightening of cervical lordosis. Thoracic spine segmentation: Appears to be normal today and on the prior CTA chest. Alignment: Stable thoracic kyphosis since 2019. No significant spondylolisthesis. Vertebrae: Signal within the disc space is abnormal at T10-T11. There is subtle abnormal T1 and STIR signal in the adjacent vertebral bodies. And there is more conspicuous abnormal epidural material posterior to T10 and surrounding the left facet at that level. See series 22, image 32 which demonstrates subsequent spinal stenosis and mild spinal cord mass effect. Possible mild cord edema there, although the degree of cord compression is mild. There is no overt facet joint fluid or facet marrow edema. However, there does seem to be developing paraspinal phlegmon there on the left, series 21, image 19) and possible developing paraspinal abscess on series 21, image 2. Superimposed degenerative endplate changes at other lower thoracic levels. Background bone marrow signal is within normal limits. No other convincing marrow edema. Cord: Normal above the T10 level. Subtle prominence of the central spinal CSF canal suspected. Below the T10-T11 disc space the cord signal and morphology also seems to normalize toward the conus which is incompletely visible at L1. Paraspinal and other soft tissues: Abnormal left lateral paraspinal, left costovertebral junction region soft tissues at T10-T11 as above. Right paraspinal soft tissues appear negative. Visible mediastinum and upper abdominal viscera appear negative. There is nonspecific streaky bilateral lower lung opacity greater on the left. No  pleural effusion. Disc levels: Ordinary disc degeneration outside of the abnormal T10-T11 level, which does not result in degenerative spinal stenosis. IMPRESSION: 1. Abnormal T10-T11 disc, epidural space, and left paraspinal soft tissues Highly Suspicious For Developing Discitis Osteomyelitis, Epidural And Paraspinal Phlegmon. There may not be a drainable fluid collection at this time, but the epidural involvement results in Spinal Stenosis With Mild Cord Compression and possible mild cord edema there. No pathologic fracture. 2. Thoracic spine degeneration elsewhere not resulting in spinal stenosis. Lumbar MRI reported separately. Electronically Signed: By: Genevie Ann M.D. On: 04/28/2022 09:30   MR LUMBAR SPINE WO CONTRAST  Result Date: 04/28/2022 CLINICAL DATA:  52 year old male with back pain and drug use. EXAM: MRI LUMBAR SPINE WITHOUT CONTRAST TECHNIQUE: Multiplanar, multisequence MR imaging of the lumbar spine was performed. No intravenous contrast was administered. COMPARISON:  CTA chest 12/15/2017. Thoracic MRI  Today. FINDINGS: Segmentation:  Normal, concordant with the thoracic numbering today. Alignment: Mild straightening of lumbar lordosis. Mild degenerative appearing retrolisthesis of L5 on S1. No significant scoliosis. Vertebrae: Widespread degenerative endplate spurring. Background bone marrow signal within normal limits. There does appear to be faint endplate edema at P8-E4 on the left but this seems to be degenerative in nature. No other convincing marrow edema or evidence of acute osseous abnormality. Intact visible sacrum and SI joints. Conus medullaris and cauda equina: Conus extends to the L1 level. Visible lower thoracic spinal cord and conus signal is normal, the abnormal T10-T11 level is not included on these images (see thoracic MRI). Paraspinal and other soft tissues: Negative. Disc levels: Age advanced although ordinary lumbar spine degeneration resulting in multifactorial mild spinal  stenosis L1-L2 through L3-L4, moderate to severe degenerative spinal stenosis at L4-L5 and L5-S1. Associated moderate to severe degenerative neural foraminal stenosis  at the bilateral L4 and L5 nerve levels. IMPRESSION: 1. No convincing acute or inflammatory process identified in the lumbar spine. See abnormal Thoracic MRI today reported separately. 2. Widespread degenerative lumbar spinal stenosis, moderate to severe at both L4-L5 and L5-S1, and mild elsewhere. Moderate to severe L4 and L5 neural foraminal stenosis. Study discussed by telephone with Dr. Eber Hong on 04/28/2022 at 09:37 . Electronically Signed   By: Odessa Fleming M.D.   On: 04/28/2022 09:37   DG Eye Foreign Body  Result Date: 04/28/2022 CLINICAL DATA:  Metal working/exposure; clearance prior to MRI EXAM: ORBITS FOR FOREIGN BODY - 2 VIEW COMPARISON:  None Available. FINDINGS: There is no evidence of metallic foreign body within the orbits. No significant bone abnormality identified. IMPRESSION: No evidence of metallic foreign body within the orbits. Electronically Signed   By: Orvan Falconer M.D.   On: 04/28/2022 08:18    Pending Labs Unresulted Labs (From admission, onward)     Start     Ordered   04/28/22 0941  Blood culture (routine x 2)  BLOOD CULTURE X 2,   R (with STAT occurrences)      04/28/22 0940   04/28/22 0941  Lactic acid, plasma  Now then every 2 hours,   R (with STAT occurrences)      04/28/22 0940            Vitals/Pain Today's Vitals   04/28/22 1030 04/28/22 1100 04/28/22 1130 04/28/22 1137  BP: (!) 121/92 131/84 139/84   Pulse: 96 (!) 101 99   Resp:  16 16   Temp:   98.4 F (36.9 C)   TempSrc:   Oral   SpO2: 97% 97% 96%   Weight:      Height:      PainSc:    7     Isolation Precautions No active isolations  Medications Medications  0.9 %  sodium chloride infusion ( Intravenous New Bag/Given 04/28/22 1017)  ketorolac (TORADOL) injection 60 mg (60 mg Intramuscular Given 04/28/22 0712)  morphine  (PF) 4 MG/ML injection 4 mg (4 mg Intravenous Given 04/28/22 1017)  vancomycin (VANCOCIN) IVPB 1000 mg/200 mL premix (1,000 mg Intravenous New Bag/Given 04/28/22 1036)    Mobility walks with person assist     R Recommendations: See Admitting Provider Note  Report given to:   Additional Notes:

## 2022-04-28 NOTE — Progress Notes (Signed)
Patient ID: Alfred Michael, male   DOB: 05/07/1970, 51 y.o.   MRN: [id number] BP 123/89 (BP Location: Right Arm)   Pulse 95   Temp 98.4 F (36.9 C) (Oral)   Resp 16   Ht 6\' 1"  (1.854 m)   Wt 102.5 kg   SpO2 97%   BMI 29.82 kg/m  Films reviewed. There is no large collection of material. At this time given his normal exam by report, I do not see operative indications. Thus he can remain safely at [facility] and be treated by the ID service. If any questions feel free to contact me.

## 2022-04-28 NOTE — Progress Notes (Addendum)
Pharmacy Antibiotic Note  Alfred Michael is a 52 y.o. male admitted on 04/28/2022 with cellulitis and back pain .  Pharmacy has been consulted for vancomycin dosing after MRI shows possible discitis with osteomyelitis. Patient currently afebrile, wbc slightly elevated at 12/5. Renal function is normal.   Vancomycin 1000 mg IV Q  hrs. Goal AUC 400-550. Expected AUC: 447 SCr used: 1.0  Plan: Vancomycin 1000 IV every 12 hours.  Goal trough 15-20 mcg/mL.  Height: 6\' 1"  (185.4 cm) Weight: 102.5 kg (226 lb) IBW/kg (Calculated) : 79.9  Temp (24hrs), Avg:98.5 F (36.9 C), Min:98.4 F (36.9 C), Max:98.7 F (37.1 C)  Recent Labs  Lab 04/28/22 1005  WBC 12.5*  CREATININE 1.08  LATICACIDVEN 0.9    Estimated Creatinine Clearance: 102.9 mL/min (by C-G formula based on SCr of 1.08 mg/dL).    No Known Allergies  Thank you for allowing pharmacy to be a part of this patient's care.  Erin Hearing PharmD., BCPS Clinical Pharmacist 04/28/2022 12:06 PM

## 2022-04-28 NOTE — Consult Note (Signed)
[place] for Infectious Disease    Date of Admission:  04/28/2022     Reason for Consult: t10-11 osteomyelitis    Referring Provider: Starla Link   Lines:  Peripheral iv's  Abx: 1/26 vanc Outpatient a course of uti abx        Assessment: 52 yo male polysubstance abuse, [facility] for mri imaging of t10-11 discitis/om/epidural phlegmon in setting of 1 week of acute onset back pain   He received abx for uti (nitrofurantoin) previous to admission the week prior to this admission, and a dose of vancomycin here.   1/26 bcx ngtd  He never had ivdu per report and said he does snort/smoke street drug; uds was positive for opiate (said he got some opioids for pain) and marijuanna, cocaine, amphetamine which he admits use to all   Plan: Would discuss with IR to get t10-11 biopsy for culture, ideally this could be done next week to allow abx washout of the system to improve yield I have asked IR to ideally do mid week next week F/u 1/26 blood culture Can wait till culture returns and start abx if no sepsis until then Discussed with primary team  I spent 75 minute reviewing data/chart, and coordinating care and >50% direct face to face time providing counseling/discussing diagnostics/treatment plan with patient       ------------------------------------------------ Principal Problem:   Discitis of thoracolumbar region Active Problems:   Hyponatremia   Polysubstance abuse (Post)    HPI: Alfred Michael is a 52 y.o. male pmh polysubstance abuse and tobacco abuse, admitted 1/26 at [facility] for 1 week back pain found to have mri concern for t10-11 discitis/om  He was seen at an osh within the time frame and negative ct scan but given abx for presumed uti He has progression of pain but no neurologic deficit in bilateral LE or urinary incontinence  He denies ivdu  He came to [facility] [place] 1/26 No fever/chill Wbc 12 Mri as below Given  vanc Bcx obtained Transferred to [facility] for further id management/wu His uds did show cocaine, amphetamine, marijuanna, and opiate  Nsg was consulted at [facility] and no surgical intervention recommended    He is rather sleepy now. Said he is hungry and haven't eat for 4 days and wants something to eat  Said back pain is very bad  He has no recent dental/medical procedure. 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 No pertinent family history.  Social History   Tobacco Use   Smoking status: Every Day    Types: Cigarettes    Last attempt to quit: 12/11/2017    Years since quitting: 4.3  Vaping Use   Vaping Use: Never used  Substance Use Topics   Alcohol use: Not Currently   Drug use: Never    No Known Allergies  Review of Systems: ROS All Other ROS was negative, except mentioned above   History reviewed. No pertinent past medical history.     Scheduled Meds:  sodium chloride flush  3 mL Intravenous Q12H   Continuous Infusions:  sodium chloride     sodium chloride     PRN Meds:.sodium chloride, acetaminophen **OR** acetaminophen, HYDROmorphone (DILAUDID) injection, ondansetron **OR** ondansetron (ZOFRAN) IV, oxyCODONE, sodium chloride flush   OBJECTIVE: Blood pressure 139/84, pulse 99, temperature 98.4 F (36.9 C), temperature source Oral, resp. rate 16, height 6\' 1"  (1.854 m),  weight 102.5 kg, SpO2 96 %.  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  Neuro: nonfocal MSK: no peripheral joint swelling/tenderness/warmth; back spine tender mid lower thoracic area     Lab Results Lab Results  Component  Value Date   WBC 12.5 (H) 04/28/2022   HGB 13.1 04/28/2022   HCT 39.1 04/28/2022   MCV 87.7 04/28/2022   PLT 280 04/28/2022    Lab Results  Component Value Date   CREATININE 1.08 04/28/2022   BUN 26 (H) 04/28/2022   NA 125 (L) 04/28/2022   K 4.1 04/28/2022   CL 89 (L) 04/28/2022   CO2 26 04/28/2022   No results found for: "ALT", "AST", "GGT", "ALKPHOS", "BILITOT"    Microbiology: No results found for this or any previous visit (from the past 240 hour(s)).   Serology:    Imaging: If present, new imagings (plain films, ct scans, and mri) have been personally visualized and interpreted; radiology reports have been reviewed. Decision making incorporated into the Impression / Recommendations.  1/26 mri lumbar spine/thoracic spine Lumbar spine: 1. No convincing acute or inflammatory process identified in the lumbar spine. See abnormal Thoracic MRI today reported separately. 2. Widespread degenerative lumbar spinal stenosis, moderate to severe at both L4-L5 and L5-S1, and mild elsewhere. Moderate to severe L4 and L5 neural foraminal stenosis. Thoracic spine: Abnormal T10-T11 disc, epidural space, and left paraspinal soft tissues Highly Suspicious For Developing Discitis Osteomyelitis, Epidural And Paraspinal Phlegmon. There may not be a drainable fluid collection at this time, but the epidural involvement results in Spinal Stenosis With Mild Cord Compression and possible mild cord edema there. No pathologic fracture.  Jabier Mutton, [place] [facility] [place] [phone] pager    04/28/2022, 1:37 PM

## 2022-04-28 NOTE — TOC Initial Note (Signed)
Transition of Care [facility]) - Initial/Assessment Note    Patient Details  Name: Alfred Michael MRN: [id number] Date of Birth: [birth year]  Transition of Care [facility]) CM/SW Contact:    Ninfa Meeker, RN Phone Number: 04/28/2022, 1:37 PM  Clinical Narrative:                 52 y.o. male admitted on 04/28/2022 with cellulitis and back pain. On IV Vanc.      Transition of Care [facility]) Department has reviewed patient and no TOC needs have been identified at this time. We will continue to monitor patient advancement through Interdisciplinary progressions and if new patient needs arise, please place a consult.      Patient Goals and CMS Choice            Expected Discharge Plan and Services                                              Prior Living Arrangements/Services                       Activities of Daily Living      Permission Sought/Granted                  Emotional Assessment              Admission diagnosis:  Discitis of thoracic region [M46.44] Discitis of thoracolumbar region [M46.45] Patient Active Problem List   Diagnosis Date Noted   Discitis of thoracolumbar region 04/28/2022   Hyponatremia 04/28/2022   Polysubstance abuse ([place]) 04/28/2022   PCP:  Center, [place]:   [address] [place] [address] Phone: [phone] Fax: [phone]  [id number] - [place], [place] - [address] [place] [address] Phone: [phone] Fax: [phone]     Social Determinants of Health (SDOH) Social History: SDOH Screenings   Tobacco Use: High Risk (04/28/2022)   SDOH Interventions:     Readmission Risk Interventions     No data to display

## 2022-04-28 NOTE — ED Notes (Signed)
Patient transported to MRI 

## 2022-04-28 NOTE — Progress Notes (Addendum)
TRH night cross cover note:   I was notified by RN of the patient's temperature of 102.5.  Other current vital signs notable for blood pressure 185/106, sinus tachycardia with heart rate 128, respiratory rate 20, and oxygen saturations in the high 90s on room air.  Per my brief chart review, this is a 52 year old male admitted with discitis/osteomyelitis with phlegmon T10-T11.   SIRS criteria previously noted to be positive with leukocytosis, tachycardia.  Blood cultures previously ordered, and the patient has received IV vancomycin, with infectious disease formally consulted and following.   Prn acetaminophen currently being given for fever.  There may be a pain contribution towards the patient's elevated blood pressure in the context of his presenting back pain leading to the diagnosis of discitis/osteomyelitis.  Existing order for prn IV Dilaudid noted.  I also initially placed order for prn IV labetalol, noting his documented history of polysubstance abuse, but subsequently discontinued this order for prn IV labetalol as it is not unit compliant.  Has not alternative, I subsequently placed one-time order for IV Vasotec after noting normal renal function and nonelevated potassium level.    Update: RN conveyed to me that preliminary blood cultures results are as follows: 4 out of 4 bottles growing gram-positive cocci. I then confirmed ongoing consultation of pharmacy for dosing of IV vancomycin.     Babs Bertin, DO Hospitalist

## 2022-04-28 NOTE — ED Provider Notes (Signed)
This patient is a 52 year old male who presents with back pain, it has been going on for at least a week, he has already been to an outside hospital because of his lower back pain where he had a CT scan that showed no acute intra-abdominal findings, a urinalysis with a small amount of blood, he was treated for a possible urinary tract infection and despite taking the antibiotic states that he has not gotten any better.  This pain is worse with movement, located in the left lower back, the patient is able to ambulate and I witnessed the patient in the room being able to stand up and give a urine sample, he states he is not sleeping well because the pain, he has been taking over-the-counter medications and I have reviewed the prescriber database for opiates and he does not have any prescribed controlled substances.  He has normal sensation and strength in his legs, he has tenderness across his back, he does not have any neurologic symptoms to suggest high risk back pain.  He will be given Toradol, Percocet and sent home with some steroids if MR negative.  Though he has progressive pain over the week may be suggestive of a more indolent occult process  I discussed the care with the admitting hospitalist, Dr. Manuella Ghazi, I appreciate his willingness to admit this patient to the hospital.  The patient will likely need to go to the larger center for infectious disease consultation, antibiotics ordered after cultures were ordered, discussed with Dr. Christella Noa of the neurosurgery service who agrees that there is no surgical findings at this time  Final diagnoses:  Discitis of thoracic region      Noemi Chapel, MD 04/28/22 1048

## 2022-04-28 NOTE — ED Notes (Signed)
Pt fell asleep before triage was complete. Oxygen sats are between 90 and 92 % on room air.

## 2022-04-28 NOTE — ED Provider Notes (Signed)
[facility] Provider Note   CSN: [id number] Arrival date & time: 04/28/22  9628     History  Chief Complaint  Patient presents with   Back Pain    Alfred Michael is a 52 y.o. male.  Patient is a 52 year old male with no significant past medical history.  Patient presenting today with complaints of low back pain.  This has been worsening over the past week.  He reports pushing a car out of the ditch prior to the onset of pain.  He describes constant pain to his low back that is severe and worse when he moves and changes position.  He denies any radiation of his pain into his legs.  He denies any bowel or bladder complaints.  Patient was seen at [facility] last week with similar complaints.  He had a urinalysis performed and a renal CT performed, both of which were essentially unremarkable.  He was told he had a urinary tract infection and was prescribed antibiotics, however this did not help.  He went there last night, then left prior to being seen due to prolonged wait time.  He comes here instead.  The history is provided by the patient.       Home Medications Prior to Admission medications   Medication Sig Start Date End Date Taking? Authorizing Provider  ibuprofen (ADVIL) 400 MG tablet Take by mouth. 04/17/22  Yes [provider]  cephALEXin (KEFLEX) 500 MG capsule Take 1 capsule (500 mg total) by mouth 4 (four) times daily. 10/19/21   Azucena Cecil, PA-C      Allergies    Patient has no known allergies.    Review of Systems   Review of Systems  All other systems reviewed and are negative.   Physical Exam Updated Vital Signs BP (!) 158/100 (BP Location: Right Arm)   Pulse (!) 113   Temp 98.7 F (37.1 C) (Oral)   Resp 18   Ht 6\' 1"  (1.854 m)   Wt 102.5 kg   SpO2 92%   BMI 29.82 kg/m  Physical Exam Vitals and nursing note reviewed.  Constitutional:      General: He is not in acute distress.     Appearance: Normal appearance. He is not ill-appearing.  HENT:     Head: Normocephalic and atraumatic.  Pulmonary:     Effort: Pulmonary effort is normal.  Musculoskeletal:        General: Normal range of motion.     Comments: There is tenderness to palpation in the soft tissues across the lumbar region.  There is no palpable abnormality.  Skin:    General: Skin is warm and dry.  Neurological:     Mental Status: He is alert and oriented to person, place, and time.     Comments: DTRs are trace and symmetrical in the patellar and Achilles tendons bilaterally.  Strength is 5 out of 5 in both lower extremities.  Patient is ambulatory, but has an antalgic gait.     ED Results / Procedures / Treatments   Labs (all labs ordered are listed, but only abnormal results are displayed) Labs Reviewed  URINALYSIS, ROUTINE W REFLEX MICROSCOPIC    EKG None  Radiology No results found.  Procedures Procedures    Medications Ordered in ED Medications  ketorolac (TORADOL) injection 60 mg (has no administration in time range)  oxyCODONE-acetaminophen (PERCOCET/ROXICET) 5-325 MG per tablet 2 tablet (has no administration in time range)  ED Course/ Medical Decision Making/ A&P  Patient presenting with low back pain as described in the HPI.  He describes severe pain to the lower back that is worse when he moves and changes position.  He arrives here with stable vital signs.  His physical examination reveals symmetrical reflexes and strength intact to both lower extremities.  He also has no bowel or bladder complaints.  Urinalysis will be obtained as patient is concerned that his urine "looks funny".  This is currently pending.  I have ordered Toradol IM and Percocet for pain.  Care will be signed out to oncoming provider at shift change for reassessment and determination of final disposition.  Final Clinical Impression(s) / ED Diagnoses Final diagnoses:  None    Rx / DC Orders ED  Discharge Orders     None         Veryl Speak, MD 04/28/22 [phone]

## 2022-04-28 NOTE — ED Triage Notes (Signed)
Pt complaining of pain in the lower back that started last week. Was given an antibiotic for a UTI and the back has not stopped hurting.

## 2022-04-28 NOTE — ED Notes (Signed)
EDP made aware of patient continually falling asleep. States to give toradol shot and hold percocet at this time.

## 2022-04-29 DIAGNOSIS — E871 Hypo-osmolality and hyponatremia: Secondary | ICD-10-CM

## 2022-04-29 DIAGNOSIS — F1910 Other psychoactive substance abuse, uncomplicated: Secondary | ICD-10-CM

## 2022-04-29 LAB — CBC
HCT: 41.6 % (ref 39.0–52.0)
Hemoglobin: 13.9 g/dL (ref 13.0–17.0)
MCH: 29.1 pg (ref 26.0–34.0)
MCHC: 33.4 g/dL (ref 30.0–36.0)
MCV: 87.2 fL (ref 80.0–100.0)
Platelets: 297 10*3/uL (ref 150–400)
RBC: 4.77 MIL/uL (ref 4.22–5.81)
RDW: 12.6 % (ref 11.5–15.5)
WBC: 12.9 10*3/uL — ABNORMAL HIGH (ref 4.0–10.5)
nRBC: 0 % (ref 0.0–0.2)

## 2022-04-29 LAB — BLOOD CULTURE ID PANEL (REFLEXED) - BCID2

## 2022-04-29 LAB — BASIC METABOLIC PANEL
Anion gap: 11 (ref 5–15)
BUN: 15 mg/dL (ref 6–20)
CO2: 28 mmol/L (ref 22–32)
Calcium: 8.1 mg/dL — ABNORMAL LOW (ref 8.9–10.3)
Chloride: 85 mmol/L — ABNORMAL LOW (ref 98–111)
Creatinine, Ser: 0.96 mg/dL (ref 0.61–1.24)
GFR, Estimated: >60 mL/min (ref >60)
Glucose, Bld: 132 mg/dL — ABNORMAL HIGH (ref 70–99)
Potassium: 3.9 mmol/L (ref 3.5–5.1)
Sodium: 124 mmol/L — ABNORMAL LOW (ref 135–145)

## 2022-04-29 LAB — MAGNESIUM: Magnesium: 1.8 mg/dL (ref 1.7–2.4)

## 2022-04-29 LAB — OSMOLALITY, URINE: Osmolality, Ur: 610 mOsm/kg (ref 300–900)

## 2022-04-29 MED ORDER — POLYETHYLENE GLYCOL 3350 17 G PO PACK: 17.0000 g | PACK | Freq: Every day | ORAL | Status: DC | PRN

## 2022-04-29 MED ORDER — LORAZEPAM 2 MG/ML IJ SOLN
1.0000 mg | Freq: Once | INTRAMUSCULAR | Status: AC
  Administered 2022-04-29: 1 mg via INTRAVENOUS
  Filled 2022-04-29: qty 1

## 2022-04-29 MED ORDER — SODIUM CHLORIDE 0.9 % IV SOLN: INTRAVENOUS | Status: DC

## 2022-04-29 MED ORDER — METHOCARBAMOL 500 MG PO TABS
500.0000 mg | ORAL_TABLET | Freq: Four times a day (QID) | ORAL | Status: DC | PRN
  Administered 2022-04-29 (×2): 500 mg via ORAL
  Filled 2022-04-29 (×2): qty 1

## 2022-04-29 MED ORDER — HALOPERIDOL LACTATE 5 MG/ML IJ SOLN: 5.0000 mg | Freq: Once | INTRAMUSCULAR | Status: DC | PRN

## 2022-04-29 MED ORDER — LORAZEPAM 2 MG/ML IJ SOLN
0.5000 mg | Freq: Four times a day (QID) | INTRAMUSCULAR | Status: DC | PRN
  Administered 2022-04-30 (×2): 1 mg via INTRAVENOUS
  Filled 2022-04-29 (×2): qty 1

## 2022-04-29 MED ORDER — HYDRALAZINE HCL 25 MG PO TABS
25.0000 mg | ORAL_TABLET | Freq: Four times a day (QID) | ORAL | Status: DC | PRN
  Administered 2022-04-29: 25 mg via ORAL
  Filled 2022-04-29: qty 1

## 2022-04-29 MED ORDER — BISACODYL 10 MG RE SUPP: 10.0000 mg | Freq: Every day | RECTAL | Status: DC | PRN

## 2022-04-29 MED ORDER — HYDRALAZINE HCL 25 MG PO TABS
25.0000 mg | ORAL_TABLET | Freq: Once | ORAL | Status: AC
  Administered 2022-04-29: 25 mg via ORAL
  Filled 2022-04-29 (×2): qty 1

## 2022-04-29 MED ORDER — OXYCODONE HCL 5 MG PO TABS
5.0000 mg | ORAL_TABLET | ORAL | Status: DC | PRN
  Administered 2022-04-29 – 2022-04-30 (×5): 10 mg via ORAL
  Administered 2022-04-30: 5 mg via ORAL
  Filled 2022-04-29 (×2): qty 2
  Filled 2022-04-29: qty 1
  Filled 2022-04-29 (×4): qty 2

## 2022-04-29 MED ORDER — SENNOSIDES-DOCUSATE SODIUM 8.6-50 MG PO TABS
1.0000 | ORAL_TABLET | Freq: Two times a day (BID) | ORAL | Status: DC
  Administered 2022-04-29 – 2022-04-30 (×3): 1 via ORAL
  Filled 2022-04-29 (×3): qty 1

## 2022-04-29 MED ORDER — HALOPERIDOL LACTATE 5 MG/ML IJ SOLN
1.0000 mg | Freq: Four times a day (QID) | INTRAMUSCULAR | Status: DC | PRN
  Administered 2022-04-29: 2 mg via INTRAVENOUS
  Filled 2022-04-29: qty 1

## 2022-04-29 MED ORDER — AMLODIPINE BESYLATE 5 MG PO TABS
5.0000 mg | ORAL_TABLET | Freq: Every day | ORAL | Status: DC
  Administered 2022-04-29 – 2022-04-30 (×2): 5 mg via ORAL
  Filled 2022-04-29 (×2): qty 1

## 2022-04-29 MED ORDER — LIDOCAINE 5 % EX PTCH
1.0000 | MEDICATED_PATCH | CUTANEOUS | Status: DC
  Administered 2022-04-29 – 2022-04-30 (×2): 1 via TRANSDERMAL
  Filled 2022-04-29 (×2): qty 1

## 2022-04-29 MED ORDER — ENOXAPARIN SODIUM 40 MG/0.4ML IJ SOSY
40.0000 mg | PREFILLED_SYRINGE | Freq: Every day | INTRAMUSCULAR | Status: DC
  Administered 2022-04-29 – 2022-04-30 (×2): 40 mg via SUBCUTANEOUS
  Filled 2022-04-29 (×2): qty 0.4

## 2022-04-29 MED ORDER — VANCOMYCIN HCL 1500 MG/300ML IV SOLN
1500.0000 mg | Freq: Two times a day (BID) | INTRAVENOUS | Status: DC
  Administered 2022-04-29 – 2022-04-30 (×4): 1500 mg via INTRAVENOUS
  Filled 2022-04-29 (×5): qty 300

## 2022-04-29 NOTE — Progress Notes (Signed)
PROGRESS NOTE    Alfred Michael  ZOX:[id number] DOB: [birth year] DOA: 04/28/2022 PCP: [facility]   Brief Narrative:  52 y.o. male with medical history significant for polysubstance abuse and tobacco abuse presented with worsening back pain.  On presentation, WBC was 12,500, sodium of 125. MRI of the thoracolumbar spine with T10-11 discitis with osteomyelitis and developing phlegmon noted. Case discussed with neurosurgery with no need for intervention surgically at this point.  He was transferred to [facility] for ID evaluation.  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 -Case discussed with neurosurgery with no need for intervention surgically at this point.  He was transferred to [facility] for ID evaluation. -Patient had received 1 dose of IV vancomycin prior to transfer to [facility]  ID evaluation appreciated: Initial plan was to hold off on antibiotics and for IR to do biopsy possibly next week.  Subsequently, patient had temperature spike and blood culture was positive for MRSA.  Vancomycin has been resumed.  2D echo.  Repeat blood cultures in AM.  Hyponatremia -Possibly from dehydration.  Sodium 124 today.  Continue hydration.  Leukocytosis -Monitor  Polysubstance abuse Tobacco abuse -Patient was counseled regarding cessation by prior hospitalist.  [facility] consult. -Urine drug screen positive for amphetamines, opiates, cocaine, tetrahydrocannabinol  Agitation -Patient was agitated earlier this morning for which Haldol and Ativan have been ordered.   DVT prophylaxis: Start Lovenox Code Status: Full Family Communication: None at bedside Disposition Plan: Status is: Inpatient Remains inpatient appropriate because: Of severity of illness    Consultants: Neurosurgery.  ID.   IR Procedures: None  Antimicrobials:  Anti-infectives (From admission, onward)    Start     Dose/Rate Route Frequency Ordered Stop   04/29/22 0115  vancomycin (VANCOREADY) IVPB 1500 mg/300 mL        1,500 mg 150 mL/hr over 120 Minutes Intravenous 2 times daily 04/29/22 0025     04/28/22 2200  vancomycin (VANCOCIN) IVPB 1000 mg/200 mL premix  Status:  Discontinued        1,000 mg 200 mL/hr over 60 Minutes Intravenous Every 12 hours 04/28/22 1205 04/28/22 1333   04/28/22 1215  vancomycin (VANCOCIN) IVPB 1000 mg/200 mL premix  Status:  Discontinued        1,000 mg 200 mL/hr over 60 Minutes Intravenous  Once 04/28/22 1205 04/28/22 1333   04/28/22 1030  vancomycin (VANCOCIN) IVPB 1000 mg/200 mL premix        1,000 mg 200 mL/hr over 60 Minutes Intravenous  Once 04/28/22 1026 04/28/22 1145        Subjective: Patient seen and examined at bedside.  Poor historian.  Had agitation earlier this morning as per nursing staff.  Having fevers.  Complains of severe back pain.  No seizures, vomiting reported.  Objective: Vitals:   04/28/22 2101 04/28/22 2314 04/29/22 0129 04/29/22 0422  BP:  (!) 185/106 (!) 160/82 (!) 177/99  Pulse:  (!) 115 (!) 108 96  Resp:  20 20 20   Temp: 98.3 F (36.8 C) (!) 102.5 F (39.2 C) 99.8 F (37.7 C) 99.4 F (37.4 C)  TempSrc: Oral Oral Oral Oral  SpO2:  96% 96% 100%  Weight:      Height:        Intake/Output Summary (Last 24 hours) at 04/29/2022 0745 Last data filed at 04/29/2022 0636 Gross per  24 hour  Intake 1403.98 ml  Output 1200 ml  Net 203.98 ml   Filed Weights   04/28/22 0624  Weight: 102.5 kg    Examination:  General exam: Appears to be in some distress due to back pain.  On room air. Respiratory system: Bilateral decreased breath sounds at bases with some scattered crackles Cardiovascular system: S1 & S2 heard, tachycardic  gastrointestinal system: Abdomen is nondistended, soft and nontender. Normal bowel sounds heard. Extremities: No  cyanosis, clubbing, edema  Central nervous system: Awake, slow to respond, poor historian.  No focal neurological deficits. Moving extremities Skin: No rashes, lesions or ulcers Psychiatry: Currently not agitated.  Flat affect    Data Reviewed: I have personally reviewed following labs and imaging studies  CBC: Recent Labs  Lab 04/28/22 1005 04/29/22 0414  WBC 12.5* 12.9*  NEUTROABS 10.3*  --   HGB 13.1 13.9  HCT 39.1 41.6  MCV 87.7 87.2  PLT 280 696   Basic Metabolic Panel: Recent Labs  Lab 04/28/22 1005 04/29/22 0414  NA 125* 124*  K 4.1 3.9  CL 89* 85*  CO2 26 28  GLUCOSE 99 132*  BUN 26* 15  CREATININE 1.08 0.96  CALCIUM 8.1* 8.1*  MG  --  1.8   GFR: Estimated Creatinine Clearance: 115.8 mL/min (by C-G formula based on SCr of 0.96 mg/dL). Liver Function Tests: No results for input(s): "AST", "ALT", "ALKPHOS", "BILITOT", "PROT", "ALBUMIN" in the last 168 hours. No results for input(s): "LIPASE", "AMYLASE" in the last 168 hours. No results for input(s): "AMMONIA" in the last 168 hours. Coagulation Profile: No results for input(s): "INR", "PROTIME" in the last 168 hours. Cardiac Enzymes: No results for input(s): "CKTOTAL", "CKMB", "CKMBINDEX", "TROPONINI" in the last 168 hours. BNP (last 3 results) No results for input(s): "PROBNP" in the last 8760 hours. HbA1C: No results for input(s): "HGBA1C" in the last 72 hours. CBG: No results for input(s): "GLUCAP" in the last 168 hours. Lipid Profile: No results for input(s): "CHOL", "HDL", "LDLCALC", "TRIG", "CHOLHDL", "LDLDIRECT" in the last 72 hours. Thyroid Function Tests: Recent Labs    04/28/22 1005  TSH 0.887   Anemia Panel: No results for input(s): "VITAMINB12", "FOLATE", "FERRITIN", "TIBC", "IRON", "RETICCTPCT" in the last 72 hours. Sepsis Labs: Recent Labs  Lab 04/28/22 1005  LATICACIDVEN 0.9    Recent Results (from the past 240 hour(s))  Blood culture (routine x 2)     Status: None (Preliminary  result)   Collection Time: 04/28/22 10:03 AM   Specimen: BLOOD  Result Value Ref Range Status   Specimen Description BLOOD RIGHT ASSIST CONTROL  Final   Special Requests   Final    BOTTLES DRAWN AEROBIC AND ANAEROBIC Blood Culture results may not be optimal due to an excessive volume of blood received in culture bottles   Culture  Setup Time   Final    GRAM POSITIVE COCCI BOTTLES DRAWN AEROBIC AND ANAEROBIC Gram Stain Report Called to,Read Back By and Verified With: G. PRUITT@[email or web address]  BY MATTHEWS, B 1.26.24   Culture   Final    NO GROWTH < 24 HOURS Performed at [facility], [address] [place], [place] [address]    Report Status PENDING  Incomplete  Blood culture (routine x 2)     Status: None (Preliminary result)   Collection Time: 04/28/22 10:05 AM   Specimen: BLOOD  Result Value Ref Range Status   Specimen Description   Final    BLOOD LEFT ASSIST CONTROL Performed at [facility],  [address], [place], [place] [address]    Special Requests   Final    BOTTLES DRAWN AEROBIC AND ANAEROBIC Blood Culture adequate volume Performed at [facility], [address] [place], [place] [address]    Culture  Setup Time   Final    GRAM POSITIVE COCCI BOTTLES DRAWN AEROBIC AND ANAEROBIC Gram Stain Report Called to,Read Back By and Verified With: G. PRUITT@2357  BY MATTHEWS, B 1.26.24 CRITICAL RESULT CALLED TO, READ BACK BY AND VERIFIED WITH: PHARMD G. ABBOTT 04/29/22 @ 0424 BY AB    Culture   Final    CULTURE REINCUBATED FOR BETTER GROWTH Performed at [facility], [address] [place], [place] [address]    Report Status PENDING  Incomplete  Blood Culture ID Panel (Reflexed)     Status: Abnormal   Collection Time: 04/28/22 10:05 AM  Result Value Ref Range Status   Enterococcus faecalis NOT DETECTED NOT DETECTED Final   Enterococcus Faecium NOT DETECTED NOT DETECTED Final   Listeria monocytogenes NOT DETECTED NOT DETECTED Final   Staphylococcus species DETECTED (A) NOT DETECTED  Final    Comment: CRITICAL RESULT CALLED TO, READ BACK BY AND VERIFIED WITH: PHARMD G. ABBOTT 04/29/22 @ 0424 BY AB    Staphylococcus aureus (BCID) DETECTED (A) NOT DETECTED Final    Comment: Methicillin (oxacillin)-resistant Staphylococcus aureus (MRSA). MRSA is predictably resistant to beta-lactam antibiotics (except ceftaroline). Preferred therapy is vancomycin unless clinically contraindicated. Patient requires contact precautions if  hospitalized. CRITICAL RESULT CALLED TO, READ BACK BY AND VERIFIED WITH: PHARMD G. ABBOTT 04/29/22 @ 0424 BY AB    Staphylococcus epidermidis NOT DETECTED NOT DETECTED Final   Staphylococcus lugdunensis NOT DETECTED NOT DETECTED Final   Streptococcus species NOT DETECTED NOT DETECTED Final   Streptococcus agalactiae NOT DETECTED NOT DETECTED Final   Streptococcus pneumoniae NOT DETECTED NOT DETECTED Final   Streptococcus pyogenes NOT DETECTED NOT DETECTED Final   A.calcoaceticus-baumannii NOT DETECTED NOT DETECTED Final   Bacteroides fragilis NOT DETECTED NOT DETECTED Final   Enterobacterales NOT DETECTED NOT DETECTED Final   Enterobacter cloacae complex NOT DETECTED NOT DETECTED Final   Escherichia coli NOT DETECTED NOT DETECTED Final   Klebsiella aerogenes NOT DETECTED NOT DETECTED Final   Klebsiella oxytoca NOT DETECTED NOT DETECTED Final   Klebsiella pneumoniae NOT DETECTED NOT DETECTED Final   Proteus species NOT DETECTED NOT DETECTED Final   Salmonella species NOT DETECTED NOT DETECTED Final   Serratia marcescens NOT DETECTED NOT DETECTED Final   Haemophilus influenzae NOT DETECTED NOT DETECTED Final   Neisseria meningitidis NOT DETECTED NOT DETECTED Final   Pseudomonas aeruginosa NOT DETECTED NOT DETECTED Final   Stenotrophomonas maltophilia NOT DETECTED NOT DETECTED Final   Candida albicans NOT DETECTED NOT DETECTED Final   Candida auris NOT DETECTED NOT DETECTED Final   Candida glabrata NOT DETECTED NOT DETECTED Final   Candida krusei NOT  DETECTED NOT DETECTED Final   Candida parapsilosis NOT DETECTED NOT DETECTED Final   Candida tropicalis NOT DETECTED NOT DETECTED Final   Cryptococcus neoformans/gattii NOT DETECTED NOT DETECTED Final   Meth resistant mecA/C and MREJ DETECTED (A) NOT DETECTED Final    Comment: CRITICAL RESULT CALLED TO, READ BACK BY AND VERIFIED WITH: PHARMD G. ABBOTT 04/29/22 @ 0424 BY AB Performed at [facility], [address], [place], [place] [address]          Radiology Studies: MR THORACIC SPINE WO CONTRAST  Addendum Date: 04/28/2022   ADDENDUM REPORT: 04/28/2022 09:41 ADDENDUM:  Study discussed by telephone with Dr. Eber Hong on 04/28/2022 at 09:37 . Electronically Signed   By: Odessa Fleming M.D.   On: 04/28/2022 09:41   Result Date: 04/28/2022 CLINICAL DATA:  52 year old male with back pain and drug use. EXAM: MRI THORACIC SPINE WITHOUT CONTRAST TECHNIQUE: Multiplanar, multisequence MR imaging of the thoracic spine was performed. No intravenous contrast was administered. COMPARISON:  CTA chest 12/15/2017. FINDINGS: Limited cervical spine imaging: Nonspecific heterogeneous marrow signal in the lower cervical spine, could be degenerative. Straightening of cervical lordosis. Thoracic spine segmentation: Appears to be normal today and on the prior CTA chest. Alignment: Stable thoracic kyphosis since 2019. No significant spondylolisthesis. Vertebrae: Signal within the disc space is abnormal at T10-T11. There is subtle abnormal T1 and STIR signal in the adjacent vertebral bodies. And there is more conspicuous abnormal epidural material posterior to T10 and surrounding the left facet at that level. See series 22, image 32 which demonstrates subsequent spinal stenosis and mild spinal cord mass effect. Possible mild cord edema there, although the degree of cord compression is mild. There is no overt facet joint fluid or facet marrow edema. However, there does seem to be developing paraspinal phlegmon there on  the left, series 21, image 19) and possible developing paraspinal abscess on series 21, image 2. Superimposed degenerative endplate changes at other lower thoracic levels. Background bone marrow signal is within normal limits. No other convincing marrow edema. Cord: Normal above the T10 level. Subtle prominence of the central spinal CSF canal suspected. Below the T10-T11 disc space the cord signal and morphology also seems to normalize toward the conus which is incompletely visible at L1. Paraspinal and other soft tissues: Abnormal left lateral paraspinal, left costovertebral junction region soft tissues at T10-T11 as above. Right paraspinal soft tissues appear negative. Visible mediastinum and upper abdominal viscera appear negative. There is nonspecific streaky bilateral lower lung opacity greater on the left. No pleural effusion. Disc levels: Ordinary disc degeneration outside of the abnormal T10-T11 level, which does not result in degenerative spinal stenosis. IMPRESSION: 1. Abnormal T10-T11 disc, epidural space, and left paraspinal soft tissues Highly Suspicious For Developing Discitis Osteomyelitis, Epidural And Paraspinal Phlegmon. There may not be a drainable fluid collection at this time, but the epidural involvement results in Spinal Stenosis With Mild Cord Compression and possible mild cord edema there. No pathologic fracture. 2. Thoracic spine degeneration elsewhere not resulting in spinal stenosis. Lumbar MRI reported separately. Electronically Signed: By: Odessa Fleming M.D. On: 04/28/2022 09:30   MR LUMBAR SPINE WO CONTRAST  Result Date: 04/28/2022 CLINICAL DATA:  52 year old male with back pain and drug use. EXAM: MRI LUMBAR SPINE WITHOUT CONTRAST TECHNIQUE: Multiplanar, multisequence MR imaging of the lumbar spine was performed. No intravenous contrast was administered. COMPARISON:  CTA chest 12/15/2017. Thoracic MRI  Today. FINDINGS: Segmentation:  Normal, concordant with the thoracic numbering today.  Alignment: Mild straightening of lumbar lordosis. Mild degenerative appearing retrolisthesis of L5 on S1. No significant scoliosis. Vertebrae: Widespread degenerative endplate spurring. Background bone marrow signal within normal limits. There does appear to be faint endplate edema at Y5-K3 on the left but this seems to be degenerative in nature. No other convincing marrow edema or evidence of acute osseous abnormality. Intact visible sacrum and SI joints. Conus medullaris and cauda equina: Conus extends to the L1 level. Visible lower thoracic spinal cord and conus signal is normal, the abnormal T10-T11 level is not included on these images (see thoracic MRI). Paraspinal and other soft tissues: Negative.  Disc levels: Age advanced although ordinary lumbar spine degeneration resulting in multifactorial mild spinal stenosis L1-L2 through L3-L4, moderate to severe degenerative spinal stenosis at L4-L5 and L5-S1. Associated moderate to severe degenerative neural foraminal stenosis at the bilateral L4 and L5 nerve levels. IMPRESSION: 1. No convincing acute or inflammatory process identified in the lumbar spine. See abnormal Thoracic MRI today reported separately. 2. Widespread degenerative lumbar spinal stenosis, moderate to severe at both L4-L5 and L5-S1, and mild elsewhere. Moderate to severe L4 and L5 neural foraminal stenosis. Study discussed by telephone with Dr. Noemi Chapel on 04/28/2022 at 09:37 . Electronically Signed   By: Genevie Ann M.D.   On: 04/28/2022 09:37   DG Eye Foreign Body  Result Date: 04/28/2022 CLINICAL DATA:  Metal working/exposure; clearance prior to MRI EXAM: ORBITS FOR FOREIGN BODY - 2 VIEW COMPARISON:  None Available. FINDINGS: There is no evidence of metallic foreign body within the orbits. No significant bone abnormality identified. IMPRESSION: No evidence of metallic foreign body within the orbits. Electronically Signed   By: Emmit Alexanders M.D.   On: 04/28/2022 08:18        Scheduled  Meds:  LORazepam  1 mg Intravenous Once   sodium chloride flush  3 mL Intravenous Q12H   Continuous Infusions:  sodium chloride     vancomycin 1,500 mg (04/29/22 0122)          Aline August, MD [facility] 04/29/2022, 7:45 AM

## 2022-04-29 NOTE — Progress Notes (Signed)
Pharmacy Antibiotic Note  Alfred Michael is a 52 y.o. male admitted on 04/28/2022 with cellulitis and back pain .  Pharmacy has been consulted for vancomycin dosing after MRI shows possible discitis with osteomyelitis, and blood cultures now growing gram positive cocci.   Plan: Vancomycin 1500 mg IV q12h  Height: 6\' 1"  (185.4 cm) Weight: 102.5 kg (226 lb) IBW/kg (Calculated) : 79.9  Temp (24hrs), Avg:99 F (37.2 C), Min:97.8 F (36.6 C), Max:102.5 F (39.2 C)  Recent Labs  Lab 04/28/22 1005  WBC 12.5*  CREATININE 1.08  LATICACIDVEN 0.9     Estimated Creatinine Clearance: 102.9 mL/min (by C-G formula based on SCr of 1.08 mg/dL).    No Known Allergies  Phillis Knack, PharmD, BCPS  04/29/2022 12:23 AM

## 2022-04-29 NOTE — Progress Notes (Signed)
PHARMACY - PHYSICIAN COMMUNICATION CRITICAL VALUE ALERT - BLOOD CULTURE IDENTIFICATION (BCID)  Alfred Michael is an 52 y.o. male who presented to [facility] on 04/28/2022 with a chief complaint of cellulitis/osteomyelitis  Assessment:  2/2 blood cultures growing MRSA  Name of physician (or Provider) Contacted:  Dr. Velia Meyer  Current antibiotics: Vancomycin   Changes to prescribed antibiotics recommended:  Patient is on recommended antibiotics - No changes needed  Results for orders placed or performed during the hospital encounter of 04/28/22  Blood Culture ID Panel (Reflexed) (Collected: 04/28/2022 10:05 AM)  Result Value Ref Range   Enterococcus faecalis NOT DETECTED NOT DETECTED   Enterococcus Faecium NOT DETECTED NOT DETECTED   Listeria monocytogenes NOT DETECTED NOT DETECTED   Staphylococcus species DETECTED (A) NOT DETECTED   Staphylococcus aureus (BCID) DETECTED (A) NOT DETECTED   Staphylococcus epidermidis NOT DETECTED NOT DETECTED   Staphylococcus lugdunensis NOT DETECTED NOT DETECTED   Streptococcus species NOT DETECTED NOT DETECTED   Streptococcus agalactiae NOT DETECTED NOT DETECTED   Streptococcus pneumoniae NOT DETECTED NOT DETECTED   Streptococcus pyogenes NOT DETECTED NOT DETECTED   A.calcoaceticus-baumannii NOT DETECTED NOT DETECTED   Bacteroides fragilis NOT DETECTED NOT DETECTED   Enterobacterales NOT DETECTED NOT DETECTED   Enterobacter cloacae complex NOT DETECTED NOT DETECTED   Escherichia coli NOT DETECTED NOT DETECTED   Klebsiella aerogenes NOT DETECTED NOT DETECTED   Klebsiella oxytoca NOT DETECTED NOT DETECTED   Klebsiella pneumoniae NOT DETECTED NOT DETECTED   Proteus species NOT DETECTED NOT DETECTED   Salmonella species NOT DETECTED NOT DETECTED   Serratia marcescens NOT DETECTED NOT DETECTED   Haemophilus influenzae NOT DETECTED NOT DETECTED   Neisseria meningitidis NOT DETECTED NOT DETECTED   Pseudomonas aeruginosa NOT DETECTED NOT DETECTED    Stenotrophomonas maltophilia NOT DETECTED NOT DETECTED   Candida albicans NOT DETECTED NOT DETECTED   Candida auris NOT DETECTED NOT DETECTED   Candida glabrata NOT DETECTED NOT DETECTED   Candida krusei NOT DETECTED NOT DETECTED   Candida parapsilosis NOT DETECTED NOT DETECTED   Candida tropicalis NOT DETECTED NOT DETECTED   Cryptococcus neoformans/gattii NOT DETECTED NOT DETECTED   Meth resistant mecA/C and MREJ DETECTED (A) NOT DETECTED    Alfred Michael 04/29/2022  4:30 AM

## 2022-04-29 NOTE — Progress Notes (Signed)
[facility]   Reason for visit: Follow up on discitis with osteomyelitis  Interval History: now with positive blood cultures with MRSA  Tmax 102.5, WBC 12.9.    Physical Exam: Constitutional:  Vitals:   04/29/22 0850 04/29/22 1041  BP: (!) 180/98 (!) 171/104  Pulse: 95   Resp: 19   Temp: 98.1 F (36.7 C)   SpO2: 98%    patient appears in NAD, arousable but falls back to sleep Respiratory: Normal respiratory effort  Review of Systems: Constitutional: negative for chills  Lab Results  Component Value Date   WBC 12.9 (H) 04/29/2022   HGB 13.9 04/29/2022   HCT 41.6 04/29/2022   MCV 87.2 04/29/2022   PLT 297 04/29/2022    Lab Results  Component Value Date   CREATININE 0.96 04/29/2022   BUN 15 04/29/2022   NA 124 (L) 04/29/2022   K 3.9 04/29/2022   CL 85 (L) 04/29/2022   CO2 28 04/29/2022   No results found for: "ALT", "AST", "GGT", "ALKPHOS"   Microbiology: Recent Results (from the past 240 hour(s))  Blood culture (routine x 2)     Status: None (Preliminary result)   Collection Time: 04/28/22 10:03 AM   Specimen: BLOOD  Result Value Ref Range Status   Specimen Description BLOOD RIGHT ASSIST CONTROL  Final   Special Requests   Final    BOTTLES DRAWN AEROBIC AND ANAEROBIC Blood Culture results may not be optimal due to an excessive volume of blood received in culture bottles   Culture  Setup Time   Final    GRAM POSITIVE COCCI BOTTLES DRAWN AEROBIC AND ANAEROBIC Gram Stain Report Called to,Read Back By and Verified With: G. PRUITT@2357  BY MATTHEWS, B 1.26.24   Culture   Final    NO GROWTH < 24 HOURS Performed at [facility], [address] [place], [place] [address]    Report Status PENDING  Incomplete  Blood culture (routine x 2)     Status: None (Preliminary result)   Collection Time: 04/28/22 10:05 AM   Specimen: BLOOD  Result Value Ref Range Status   Specimen Description   Final    BLOOD LEFT ASSIST CONTROL Performed at  [facility], [address] [place], [place] [address]    Special Requests   Final    BOTTLES DRAWN AEROBIC AND ANAEROBIC Blood Culture adequate volume Performed at [facility], [address] [place], [place] [address]    Culture  Setup Time   Final    GRAM POSITIVE COCCI BOTTLES DRAWN AEROBIC AND ANAEROBIC Gram Stain Report Called to,Read Back By and Verified With: G. PRUITT@[id number]  BY MATTHEWS, B 1.26.24 CRITICAL RESULT CALLED TO, READ BACK BY AND VERIFIED WITH: PHARMD G. ABBOTT 04/29/22 @ 0424 BY AB    Culture   Final    CULTURE REINCUBATED FOR BETTER GROWTH Performed at [facility], [place] [address], [place], [place] [address]    Report Status PENDING  Incomplete  Blood Culture ID Panel (Reflexed)     Status: Abnormal   Collection Time: 04/28/22 10:05 AM  Result Value Ref Range Status   Enterococcus faecalis NOT DETECTED NOT DETECTED Final   Enterococcus Faecium NOT DETECTED NOT DETECTED Final   Listeria monocytogenes NOT DETECTED NOT DETECTED Final   Staphylococcus species DETECTED (A) NOT DETECTED Final    Comment: CRITICAL RESULT CALLED TO, READ BACK BY AND VERIFIED WITH: PHARMD G. ABBOTT 04/29/22 @ 0424 BY AB    Staphylococcus aureus (  BCID) DETECTED (A) NOT DETECTED Final    Comment: Methicillin (oxacillin)-resistant Staphylococcus aureus (MRSA). MRSA is predictably resistant to beta-lactam antibiotics (except ceftaroline). Preferred therapy is vancomycin unless clinically contraindicated. Patient requires contact precautions if  hospitalized. CRITICAL RESULT CALLED TO, READ BACK BY AND VERIFIED WITH: PHARMD G. ABBOTT 04/29/22 @ 0424 BY AB    Staphylococcus epidermidis NOT DETECTED NOT DETECTED Final   Staphylococcus lugdunensis NOT DETECTED NOT DETECTED Final   Streptococcus species NOT DETECTED NOT DETECTED Final   Streptococcus agalactiae NOT DETECTED NOT DETECTED Final   Streptococcus pneumoniae NOT DETECTED NOT DETECTED Final   Streptococcus pyogenes NOT DETECTED  NOT DETECTED Final   A.calcoaceticus-baumannii NOT DETECTED NOT DETECTED Final   Bacteroides fragilis NOT DETECTED NOT DETECTED Final   Enterobacterales NOT DETECTED NOT DETECTED Final   Enterobacter cloacae complex NOT DETECTED NOT DETECTED Final   Escherichia coli NOT DETECTED NOT DETECTED Final   Klebsiella aerogenes NOT DETECTED NOT DETECTED Final   Klebsiella oxytoca NOT DETECTED NOT DETECTED Final   Klebsiella pneumoniae NOT DETECTED NOT DETECTED Final   Proteus species NOT DETECTED NOT DETECTED Final   Salmonella species NOT DETECTED NOT DETECTED Final   Serratia marcescens NOT DETECTED NOT DETECTED Final   Haemophilus influenzae NOT DETECTED NOT DETECTED Final   Neisseria meningitidis NOT DETECTED NOT DETECTED Final   Pseudomonas aeruginosa NOT DETECTED NOT DETECTED Final   Stenotrophomonas maltophilia NOT DETECTED NOT DETECTED Final   Candida albicans NOT DETECTED NOT DETECTED Final   Candida auris NOT DETECTED NOT DETECTED Final   Candida glabrata NOT DETECTED NOT DETECTED Final   Candida krusei NOT DETECTED NOT DETECTED Final   Candida parapsilosis NOT DETECTED NOT DETECTED Final   Candida tropicalis NOT DETECTED NOT DETECTED Final   Cryptococcus neoformans/gattii NOT DETECTED NOT DETECTED Final   Meth resistant mecA/C and MREJ DETECTED (A) NOT DETECTED Final    Comment: CRITICAL RESULT CALLED TO, READ BACK BY AND VERIFIED WITH: PHARMD G. ABBOTT 04/29/22 @ 0424 BY AB Performed at [facility], [address] [place], [place] [address]     Impression/Plan:  1. Discitis/osteomyelitis - Thoracic findings and now associated with bacteremia.   He will need prolonged antibiotics and back on vancomycin now and will continue.  No indication now for consideration of disc aspiration so will cancel order.   2.  MRSA bacteremia - TTE ordered.  Will need repeat blood cultures.   3.  Medication monitoring - will continue to monitor creat on vancomycin.  Currently wnl.

## 2022-04-29 NOTE — Progress Notes (Signed)
[facility] night cross cover note:   I was notified by RN that this patient is agitated, acting out, yelling out.  I placed order for Ativan 1 mg IV x 1 dose now as well as an additional order for Haldol 5 mg IV x 1 prn for agitation.      Babs Bertin, DO Hospitalist

## 2022-04-29 NOTE — Plan of Care (Signed)

## 2022-04-29 NOTE — Progress Notes (Signed)
Patient had elevated BP, HR, and temperature. Hospitalist on call notified. See MAR from medications ordered and given. At this time, patient has responded to medications.

## 2022-04-30 ENCOUNTER — Inpatient Hospital Stay (HOSPITAL_COMMUNITY): Payer: Medicaid Other

## 2022-04-30 DIAGNOSIS — R7881 Bacteremia: Secondary | ICD-10-CM

## 2022-04-30 LAB — CBC WITH DIFFERENTIAL/PLATELET
Abs Immature Granulocytes: 0.25 10*3/uL — ABNORMAL HIGH (ref 0.00–0.07)
Basophils Absolute: 0.1 10*3/uL (ref 0.0–0.1)
Basophils Relative: 1 %
Eosinophils Absolute: 0.1 10*3/uL (ref 0.0–0.5)
Eosinophils Relative: 1 %
HCT: 40.7 % (ref 39.0–52.0)
Hemoglobin: 14 g/dL (ref 13.0–17.0)
Immature Granulocytes: 2 %
Lymphocytes Relative: 7 %
Lymphs Abs: 1 10*3/uL (ref 0.7–4.0)
MCH: 29.3 pg (ref 26.0–34.0)
MCHC: 34.4 g/dL (ref 30.0–36.0)
MCV: 85.1 fL (ref 80.0–100.0)
Monocytes Absolute: 1.5 10*3/uL — ABNORMAL HIGH (ref 0.1–1.0)
Monocytes Relative: 11 %
Neutro Abs: 11.4 10*3/uL — ABNORMAL HIGH (ref 1.7–7.7)
Neutrophils Relative %: 78 %
Platelets: 303 10*3/uL (ref 150–400)
RBC: 4.78 MIL/uL (ref 4.22–5.81)
RDW: 12.8 % (ref 11.5–15.5)
WBC: 14.4 10*3/uL — ABNORMAL HIGH (ref 4.0–10.5)
nRBC: 0 % (ref 0.0–0.2)

## 2022-04-30 LAB — ECHOCARDIOGRAM COMPLETE
Area-P 1/2: 3.93 cm2
Calc EF: 57.3 %
Height: 73 in
S' Lateral: 4.1 cm
Single Plane A2C EF: 58.1 %
Single Plane A4C EF: 62 %
Weight: 3616 oz

## 2022-04-30 LAB — BASIC METABOLIC PANEL
Anion gap: 14 (ref 5–15)
BUN: 15 mg/dL (ref 6–20)
CO2: 23 mmol/L (ref 22–32)
Calcium: 8.3 mg/dL — ABNORMAL LOW (ref 8.9–10.3)
Chloride: 85 mmol/L — ABNORMAL LOW (ref 98–111)
Creatinine, Ser: 0.89 mg/dL (ref 0.61–1.24)
GFR, Estimated: >60 mL/min (ref >60)
Glucose, Bld: 118 mg/dL — ABNORMAL HIGH (ref 70–99)
Potassium: 3.7 mmol/L (ref 3.5–5.1)
Sodium: 122 mmol/L — ABNORMAL LOW (ref 135–145)

## 2022-04-30 LAB — MAGNESIUM: Magnesium: 1.6 mg/dL — ABNORMAL LOW (ref 1.7–2.4)

## 2022-04-30 LAB — C-REACTIVE PROTEIN: CRP: 14.7 mg/dL — ABNORMAL HIGH (ref <1.0)

## 2022-04-30 LAB — HEPATIC FUNCTION PANEL
ALT: 88 U/L — ABNORMAL HIGH (ref 0–44)
AST: 49 U/L — ABNORMAL HIGH (ref 15–41)
Albumin: 2.1 g/dL — ABNORMAL LOW (ref 3.5–5.0)
Alkaline Phosphatase: 190 U/L — ABNORMAL HIGH (ref 38–126)
Bilirubin, Direct: 0.3 mg/dL — ABNORMAL HIGH (ref 0.0–0.2)
Indirect Bilirubin: 0.6 mg/dL (ref 0.3–0.9)
Total Bilirubin: 0.9 mg/dL (ref 0.3–1.2)
Total Protein: 6.5 g/dL (ref 6.5–8.1)

## 2022-04-30 LAB — AMMONIA: Ammonia: 33 umol/L (ref 9–35)

## 2022-04-30 MED ORDER — IPRATROPIUM-ALBUTEROL 0.5-2.5 (3) MG/3ML IN SOLN
3.0000 mL | Freq: Four times a day (QID) | RESPIRATORY_TRACT | Status: DC
  Administered 2022-04-30: 3 mL via RESPIRATORY_TRACT
  Filled 2022-04-30 (×2): qty 3

## 2022-04-30 MED ORDER — SODIUM CHLORIDE 1 G PO TABS
1.0000 g | ORAL_TABLET | Freq: Three times a day (TID) | ORAL | Status: DC
  Administered 2022-04-30: 1 g via ORAL
  Filled 2022-04-30: qty 1

## 2022-04-30 MED ORDER — BUDESONIDE 0.5 MG/2ML IN SUSP
0.5000 mg | Freq: Two times a day (BID) | RESPIRATORY_TRACT | Status: DC
  Administered 2022-04-30: 0.5 mg via RESPIRATORY_TRACT
  Filled 2022-04-30 (×2): qty 2

## 2022-04-30 MED ORDER — AMLODIPINE BESYLATE 10 MG PO TABS: 10.0000 mg | ORAL_TABLET | Freq: Every day | ORAL | Status: DC

## 2022-04-30 MED ORDER — ALBUTEROL SULFATE (2.5 MG/3ML) 0.083% IN NEBU
2.5000 mg | INHALATION_SOLUTION | RESPIRATORY_TRACT | Status: DC | PRN
  Administered 2022-04-30: 2.5 mg via RESPIRATORY_TRACT
  Filled 2022-04-30: qty 3

## 2022-04-30 MED ORDER — MAGNESIUM SULFATE 2 GM/50ML IV SOLN
2.0000 g | Freq: Once | INTRAVENOUS | Status: AC
  Administered 2022-04-30: 2 g via INTRAVENOUS
  Filled 2022-04-30: qty 50

## 2022-04-30 MED ORDER — FUROSEMIDE 10 MG/ML IJ SOLN
60.0000 mg | Freq: Once | INTRAMUSCULAR | Status: AC
  Administered 2022-04-30: 60 mg via INTRAVENOUS
  Filled 2022-04-30: qty 6

## 2022-04-30 MED ORDER — LORAZEPAM 2 MG/ML IJ SOLN: 0.5000 mg | Freq: Four times a day (QID) | INTRAMUSCULAR | Status: DC | PRN

## 2022-04-30 NOTE — Progress Notes (Signed)
Echocardiogram 2D Echocardiogram has been performed.  Fidel Levy 04/30/2022, 10:09 AM

## 2022-04-30 NOTE — Progress Notes (Signed)
PROGRESS NOTE    Alfred Michael  HYI:[id number] DOB: [birth year] DOA: 04/28/2022 PCP: [facility]   Brief Narrative:  52 y.o. male with medical history significant for polysubstance abuse and tobacco abuse presented with worsening back pain.  On presentation, WBC was 12,500, sodium of 125. MRI of the thoracolumbar spine with T10-11 discitis with osteomyelitis and developing phlegmon noted. Case discussed with neurosurgery with no need for intervention surgically at this point.  He was transferred to [facility] for ID evaluation.  He was found to have MRSA bacteremia.  Vancomycin was started.  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 -Case discussed with neurosurgery: no need for intervention surgically at this point.  He was transferred to [facility] for ID evaluation. -Continue vancomycin.  2D echo.  Repeat blood cultures from today.  Hyponatremia -Possibly from dehydration.  Sodium pending today.   Dyspnea -Patient looks dyspneic this morning.  IV fluids.  Check chest x-ray.  Give 1 dose of IV Lasix.  Follow-up blood work from this morning.  Leukocytosis -Monitor  Polysubstance abuse Tobacco abuse -Patient was counseled regarding cessation by prior hospitalist.  [facility] consult. -Urine drug screen positive for amphetamines, opiates, cocaine, tetrahydrocannabinol  Agitation -On as needed Ativan/Haldol  Hypertension -Blood pressure extremely elevated.  Not on any antihypertensives at home.  Has been started on amlodipine.  Increase dose to 10 mg daily.  Continue hydralazine as needed  DVT prophylaxis:  Lovenox Code Status: Full Family Communication: None at bedside Disposition Plan: Status is: Inpatient Remains inpatient appropriate because: Of severity of illness    Consultants:  Neurosurgery.  ID.  IR Procedures: None  Antimicrobials:  Anti-infectives (From admission, onward)    Start     Dose/Rate Route Frequency Ordered Stop   04/29/22 0115  vancomycin (VANCOREADY) IVPB 1500 mg/300 mL        1,500 mg 150 mL/hr over 120 Minutes Intravenous 2 times daily 04/29/22 0025     04/28/22 2200  vancomycin (VANCOCIN) IVPB 1000 mg/200 mL premix  Status:  Discontinued        1,000 mg 200 mL/hr over 60 Minutes Intravenous Every 12 hours 04/28/22 1205 04/28/22 1333   04/28/22 1215  vancomycin (VANCOCIN) IVPB 1000 mg/200 mL premix  Status:  Discontinued        1,000 mg 200 mL/hr over 60 Minutes Intravenous  Once 04/28/22 1205 04/28/22 1333   04/28/22 1030  vancomycin (VANCOCIN) IVPB 1000 mg/200 mL premix        1,000 mg 200 mL/hr over 60 Minutes Intravenous  Once 04/28/22 1026 04/28/22 1145        Subjective: Patient seen and examined at bedside.  Poor historian.  Sleepy, wakes up only very slightly, hardly answers any questions.  No fever, vomiting, seizures reported. Objective: Vitals:   04/29/22 0850 04/29/22 1041 04/29/22 1531 04/29/22 2148  BP: (!) 180/98 (!) 171/104 (!) 183/100 (!) 165/97  Pulse: 95  (!) 109 (!) 110  Resp: 19  18 20   Temp: 98.1 F (36.7 C)  97.7 F (36.5 C) 98.9 F (37.2 C)  TempSrc: Oral  Oral Oral  SpO2: 98%  98% 98%  Weight:      Height:        Intake/Output Summary (Last 24 hours) at 04/30/2022 0728 Last data filed at 04/30/2022 0650 Gross per 24 hour  Intake 3657.66 ml  Output 2575 ml  Net 1082.66 ml    Filed Weights   04/28/22 0624  Weight: 102.5 kg    Examination:  General: On room air.  No distress ENT/neck: No thyromegaly.  JVD is not elevated  respiratory: Decreased breath sounds at bases bilaterally with scattered crackles and some wheezing.  Tachypneic intermittently.   CVS: S1-S2 heard, mildly tachycardic intermittently Abdominal: Soft, nontender, slightly distended; no organomegaly, bowel sounds are  heard Extremities: Trace lower extremity edema; no cyanosis  CNS: Sleepy, wakes up only very slightly, hardly answers any questions.  Poor historian.  No focal neurologic deficit.  Moves extremities Lymph: No obvious lymphadenopathy Skin: No obvious ecchymosis/lesions  psych: Mostly flat affect.  Not agitated.   Musculoskeletal: No obvious joint swelling/deformity     Data Reviewed: I have personally reviewed following labs and imaging studies  CBC: Recent Labs  Lab 04/28/22 1005 04/29/22 0414  WBC 12.5* 12.9*  NEUTROABS 10.3*  --   HGB 13.1 13.9  HCT 39.1 41.6  MCV 87.7 87.2  PLT 280 867    Basic Metabolic Panel: Recent Labs  Lab 04/28/22 1005 04/29/22 0414  NA 125* 124*  K 4.1 3.9  CL 89* 85*  CO2 26 28  GLUCOSE 99 132*  BUN 26* 15  CREATININE 1.08 0.96  CALCIUM 8.1* 8.1*  MG  --  1.8    GFR: Estimated Creatinine Clearance: 115.8 mL/min (by C-G formula based on SCr of 0.96 mg/dL). Liver Function Tests: No results for input(s): "AST", "ALT", "ALKPHOS", "BILITOT", "PROT", "ALBUMIN" in the last 168 hours. No results for input(s): "LIPASE", "AMYLASE" in the last 168 hours. No results for input(s): "AMMONIA" in the last 168 hours. Coagulation Profile: No results for input(s): "INR", "PROTIME" in the last 168 hours. Cardiac Enzymes: No results for input(s): "CKTOTAL", "CKMB", "CKMBINDEX", "TROPONINI" in the last 168 hours. BNP (last 3 results) No results for input(s): "PROBNP" in the last 8760 hours. HbA1C: No results for input(s): "HGBA1C" in the last 72 hours. CBG: No results for input(s): "GLUCAP" in the last 168 hours. Lipid Profile: No results for input(s): "CHOL", "HDL", "LDLCALC", "TRIG", "CHOLHDL", "LDLDIRECT" in the last 72 hours. Thyroid Function Tests: Recent Labs    04/28/22 1005  TSH 0.887    Anemia Panel: No results for input(s): "VITAMINB12", "FOLATE", "FERRITIN", "TIBC", "IRON", "RETICCTPCT" in the last 72 hours. Sepsis Labs: Recent  Labs  Lab 04/28/22 1005  LATICACIDVEN 0.9     Recent Results (from the past 240 hour(s))  Blood culture (routine x 2)     Status: None (Preliminary result)   Collection Time: 04/28/22 10:03 AM   Specimen: BLOOD  Result Value Ref Range Status   Specimen Description BLOOD RIGHT ASSIST CONTROL  Final   Special Requests   Final    BOTTLES DRAWN AEROBIC AND ANAEROBIC Blood Culture results may not be optimal due to an excessive volume of blood received in culture bottles   Culture  Setup Time   Final    GRAM POSITIVE COCCI BOTTLES DRAWN AEROBIC AND ANAEROBIC Gram Stain Report Called to,Read Back By and Verified With: G. PRUITT@2357  BY MATTHEWS, B 1.26.24   Culture   Final    NO GROWTH 2 DAYS Performed at [facility], [address], [place], [place] [address]    Report Status PENDING  Incomplete  Blood culture (routine x 2)     Status: None (Preliminary result)   Collection Time: 04/28/22 10:05 AM   Specimen: BLOOD  Result Value Ref Range Status  Specimen Description   Final    BLOOD LEFT ASSIST CONTROL Performed at [facility], [address] [place], [place] [address]    Special Requests   Final    BOTTLES DRAWN AEROBIC AND ANAEROBIC Blood Culture adequate volume Performed at [facility] [place], [address], [place], [place] [address]    Culture  Setup Time   Final    GRAM POSITIVE COCCI BOTTLES DRAWN AEROBIC AND ANAEROBIC Gram Stain Report Called to,Read Back By and Verified With: G. PRUITT@2357  BY MATTHEWS, B 1.26.24 CRITICAL RESULT CALLED TO, READ BACK BY AND VERIFIED WITH: PHARMD G. ABBOTT 04/29/22 @ 0424 BY AB    Culture   Final    CULTURE REINCUBATED FOR BETTER GROWTH Performed at [facility], [place] [address], [place], [place] [address]    Report Status PENDING  Incomplete  Blood Culture ID Panel (Reflexed)     Status: Abnormal   Collection Time: 04/28/22 10:05 AM  Result Value Ref Range Status   Enterococcus faecalis NOT DETECTED NOT DETECTED Final   Enterococcus  Faecium NOT DETECTED NOT DETECTED Final   Listeria monocytogenes NOT DETECTED NOT DETECTED Final   Staphylococcus species DETECTED (A) NOT DETECTED Final    Comment: CRITICAL RESULT CALLED TO, READ BACK BY AND VERIFIED WITH: PHARMD G. ABBOTT 04/29/22 @ 0424 BY AB    Staphylococcus aureus (BCID) DETECTED (A) NOT DETECTED Final    Comment: Methicillin (oxacillin)-resistant Staphylococcus aureus (MRSA). MRSA is predictably resistant to beta-lactam antibiotics (except ceftaroline). Preferred therapy is vancomycin unless clinically contraindicated. Patient requires contact precautions if  hospitalized. CRITICAL RESULT CALLED TO, READ BACK BY AND VERIFIED WITH: PHARMD G. ABBOTT 04/29/22 @ 0424 BY AB    Staphylococcus epidermidis NOT DETECTED NOT DETECTED Final   Staphylococcus lugdunensis NOT DETECTED NOT DETECTED Final   Streptococcus species NOT DETECTED NOT DETECTED Final   Streptococcus agalactiae NOT DETECTED NOT DETECTED Final   Streptococcus pneumoniae NOT DETECTED NOT DETECTED Final   Streptococcus pyogenes NOT DETECTED NOT DETECTED Final   A.calcoaceticus-baumannii NOT DETECTED NOT DETECTED Final   Bacteroides fragilis NOT DETECTED NOT DETECTED Final   Enterobacterales NOT DETECTED NOT DETECTED Final   Enterobacter cloacae complex NOT DETECTED NOT DETECTED Final   Escherichia coli NOT DETECTED NOT DETECTED Final   Klebsiella aerogenes NOT DETECTED NOT DETECTED Final   Klebsiella oxytoca NOT DETECTED NOT DETECTED Final   Klebsiella pneumoniae NOT DETECTED NOT DETECTED Final   Proteus species NOT DETECTED NOT DETECTED Final   Salmonella species NOT DETECTED NOT DETECTED Final   Serratia marcescens NOT DETECTED NOT DETECTED Final   Haemophilus influenzae NOT DETECTED NOT DETECTED Final   Neisseria meningitidis NOT DETECTED NOT DETECTED Final   Pseudomonas aeruginosa NOT DETECTED NOT DETECTED Final   Stenotrophomonas maltophilia NOT DETECTED NOT DETECTED Final   Candida albicans NOT  DETECTED NOT DETECTED Final   Candida auris NOT DETECTED NOT DETECTED Final   Candida glabrata NOT DETECTED NOT DETECTED Final   Candida krusei NOT DETECTED NOT DETECTED Final   Candida parapsilosis NOT DETECTED NOT DETECTED Final   Candida tropicalis NOT DETECTED NOT DETECTED Final   Cryptococcus neoformans/gattii NOT DETECTED NOT DETECTED Final   Meth resistant mecA/C and MREJ DETECTED (A) NOT DETECTED Final    Comment: CRITICAL RESULT CALLED TO, READ BACK BY AND VERIFIED WITH: PHARMD G. ABBOTT 04/29/22 @ 0424 BY AB Performed at [facility], [address], [place], [place] [address]          Radiology  Studies: MR THORACIC SPINE WO CONTRAST  Addendum Date: 04/28/2022   ADDENDUM REPORT: 04/28/2022 09:41 ADDENDUM: Study discussed by telephone with Dr. Eber Hong on 04/28/2022 at 09:37 . Electronically Signed   By: Odessa Fleming M.D.   On: 04/28/2022 09:41   Result Date: 04/28/2022 CLINICAL DATA:  52 year old male with back pain and drug use. EXAM: MRI THORACIC SPINE WITHOUT CONTRAST TECHNIQUE: Multiplanar, multisequence MR imaging of the thoracic spine was performed. No intravenous contrast was administered. COMPARISON:  CTA chest 12/15/2017. FINDINGS: Limited cervical spine imaging: Nonspecific heterogeneous marrow signal in the lower cervical spine, could be degenerative. Straightening of cervical lordosis. Thoracic spine segmentation: Appears to be normal today and on the prior CTA chest. Alignment: Stable thoracic kyphosis since 2019. No significant spondylolisthesis. Vertebrae: Signal within the disc space is abnormal at T10-T11. There is subtle abnormal T1 and STIR signal in the adjacent vertebral bodies. And there is more conspicuous abnormal epidural material posterior to T10 and surrounding the left facet at that level. See series 22, image 32 which demonstrates subsequent spinal stenosis and mild spinal cord mass effect. Possible mild cord edema there, although the degree of cord  compression is mild. There is no overt facet joint fluid or facet marrow edema. However, there does seem to be developing paraspinal phlegmon there on the left, series 21, image 19) and possible developing paraspinal abscess on series 21, image 2. Superimposed degenerative endplate changes at other lower thoracic levels. Background bone marrow signal is within normal limits. No other convincing marrow edema. Cord: Normal above the T10 level. Subtle prominence of the central spinal CSF canal suspected. Below the T10-T11 disc space the cord signal and morphology also seems to normalize toward the conus which is incompletely visible at L1. Paraspinal and other soft tissues: Abnormal left lateral paraspinal, left costovertebral junction region soft tissues at T10-T11 as above. Right paraspinal soft tissues appear negative. Visible mediastinum and upper abdominal viscera appear negative. There is nonspecific streaky bilateral lower lung opacity greater on the left. No pleural effusion. Disc levels: Ordinary disc degeneration outside of the abnormal T10-T11 level, which does not result in degenerative spinal stenosis. IMPRESSION: 1. Abnormal T10-T11 disc, epidural space, and left paraspinal soft tissues Highly Suspicious For Developing Discitis Osteomyelitis, Epidural And Paraspinal Phlegmon. There may not be a drainable fluid collection at this time, but the epidural involvement results in Spinal Stenosis With Mild Cord Compression and possible mild cord edema there. No pathologic fracture. 2. Thoracic spine degeneration elsewhere not resulting in spinal stenosis. Lumbar MRI reported separately. Electronically Signed: By: Odessa Fleming M.D. On: 04/28/2022 09:30   MR LUMBAR SPINE WO CONTRAST  Result Date: 04/28/2022 CLINICAL DATA:  52 year old male with back pain and drug use. EXAM: MRI LUMBAR SPINE WITHOUT CONTRAST TECHNIQUE: Multiplanar, multisequence MR imaging of the lumbar spine was performed. No intravenous contrast  was administered. COMPARISON:  CTA chest 12/15/2017. Thoracic MRI  Today. FINDINGS: Segmentation:  Normal, concordant with the thoracic numbering today. Alignment: Mild straightening of lumbar lordosis. Mild degenerative appearing retrolisthesis of L5 on S1. No significant scoliosis. Vertebrae: Widespread degenerative endplate spurring. Background bone marrow signal within normal limits. There does appear to be faint endplate edema at Z6-X0 on the left but this seems to be degenerative in nature. No other convincing marrow edema or evidence of acute osseous abnormality. Intact visible sacrum and SI joints. Conus medullaris and cauda equina: Conus extends to the L1 level. Visible lower thoracic spinal cord and conus signal is normal, the abnormal  T10-T11 level is not included on these images (see thoracic MRI). Paraspinal and other soft tissues: Negative. Disc levels: Age advanced although ordinary lumbar spine degeneration resulting in multifactorial mild spinal stenosis L1-L2 through L3-L4, moderate to severe degenerative spinal stenosis at L4-L5 and L5-S1. Associated moderate to severe degenerative neural foraminal stenosis at the bilateral L4 and L5 nerve levels. IMPRESSION: 1. No convincing acute or inflammatory process identified in the lumbar spine. See abnormal Thoracic MRI today reported separately. 2. Widespread degenerative lumbar spinal stenosis, moderate to severe at both L4-L5 and L5-S1, and mild elsewhere. Moderate to severe L4 and L5 neural foraminal stenosis. Study discussed by telephone with Dr. Eber Hong on 04/28/2022 at 09:37 . Electronically Signed   By: Odessa Fleming M.D.   On: 04/28/2022 09:37   DG Eye Foreign Body  Result Date: 04/28/2022 CLINICAL DATA:  Metal working/exposure; clearance prior to MRI EXAM: ORBITS FOR FOREIGN BODY - 2 VIEW COMPARISON:  None Available. FINDINGS: There is no evidence of metallic foreign body within the orbits. No significant bone abnormality identified.  IMPRESSION: No evidence of metallic foreign body within the orbits. Electronically Signed   By: Orvan Falconer M.D.   On: 04/28/2022 08:18        Scheduled Meds:  amLODipine  5 mg Oral Daily   enoxaparin (LOVENOX) injection  40 mg Subcutaneous Daily   lidocaine  1 patch Transdermal Q24H   senna-docusate  1 tablet Oral BID   sodium chloride flush  3 mL Intravenous Q12H   Continuous Infusions:  sodium chloride     vancomycin Stopped (04/29/22 2350)          Glade Lloyd, MD [facility] 04/30/2022, 7:28 AM

## 2022-04-30 NOTE — Progress Notes (Signed)
Pt family notified of transfer.

## 2022-05-01 LAB — CULTURE, BLOOD (ROUTINE X 2): Special Requests: ADEQUATE

## 2022-05-01 NOTE — Discharge Summary (Signed)
[facility] Discharge Summary   Patient: Alfred HammingJeffrey T Michael ZOX:[id number]   PCP: [facility] DOB: 07/11/1970   Date of admission: 04/28/2022   Date of discharge: 04/30/2022    Discharge Disposition: Patient signed out AMA despite being explained the risks of doing so including worsening medical condition as well as death.   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 male with medical history significant for polysubstance abuse and tobacco abuse presented with worsening back pain.  On presentation, WBC was 12,500, sodium of 125. MRI of the thoracolumbar spine with T10-11 discitis with osteomyelitis and developing phlegmon noted. Case discussed with neurosurgery with no need for intervention surgically at this point.  [facility].  He was found to have MRSA bacteremia.  Vancomycin was started.  Echo had shown no vegetations.  Repeat blood cultures were pending.  He apparently signed out AGAINST MEDICAL ADVICE on 04/30/2022.  Please refer to the progress notes for full details.   Procedures and Results: Echo  Consultations: ID/neurosurgery  The results of significant diagnostics from this hospitalization (including imaging, microbiology, ancillary and laboratory) are listed below for reference.    Significant Diagnostic Studies: DG CHEST PORT 1 VIEW  Result Date: 04/30/2022 CLINICAL DATA:  Dyspnea. EXAM: PORTABLE CHEST 1 VIEW COMPARISON:  12/14/2017 FINDINGS: Poor inspiration. Grossly normal sized heart taking the poor inspiration and portable AP technique into consideration. Increased prominence of the pulmonary vasculature, also accentuated by the poor inspiration. No airspace consolidation or pleural fluid. Thoracic spine  degenerative changes. IMPRESSION: Poor inspiration with interval borderline cardiomegaly and mild pulmonary vascular congestion, accentuated by the poor inspiration. Electronically Signed   By: Beckie SaltsSteven  Michael M.D.   On: 04/30/2022 11:54   ECHOCARDIOGRAM COMPLETE  Result Date: 04/30/2022    ECHOCARDIOGRAM REPORT   Patient Name:   Alfred HammingJEFFREY T Karren Date of Exam: 04/30/2022 Medical Rec #:  [id number]       Height:       73.0 in Accession #:    [id number]      Weight:       226.0 lb Date of Birth:  08/16/1970        BSA:          2.266 m Patient Age:    52 years        BP:           165/97 mmHg Patient Gender: M               HR:           118 bpm. Exam Location:  Inpatient Procedure: 2D Echo, Cardiac Doppler and Color Doppler Indications:    Bacteremia  History:        Patient has no prior history of Echocardiogram examinations.  Sonographer:    Eulah PontSarah Pirrotta RDCS Referring Phys: [id number] [facility] [place] IMPRESSIONS  1. Left ventricular ejection fraction, by estimation, is 60 to 65%. The left ventricle has normal function. The left ventricle has no regional wall motion abnormalities. Left ventricular diastolic parameters are consistent with Grade I diastolic dysfunction (impaired relaxation).  2. Right ventricular systolic function is normal. The right ventricular size is normal.  3. The mitral valve is normal in structure. Trivial mitral valve regurgitation. No evidence of mitral stenosis.  4. The aortic valve is tricuspid.  Aortic valve regurgitation is not visualized. No aortic stenosis is present.  5. The inferior vena cava is normal in size with greater than 50% respiratory variability, suggesting right atrial pressure of 3 mmHg. Comparison(s): No prior Echocardiogram. FINDINGS  Left Ventricle: Left ventricular ejection fraction, by estimation, is 60 to 65%. The left ventricle has normal function. The left ventricle has no regional wall motion abnormalities. The left ventricular internal cavity size was normal in size.  There is  no left ventricular hypertrophy. Left ventricular diastolic parameters are consistent with Grade I diastolic dysfunction (impaired relaxation). Right Ventricle: The right ventricular size is normal. Right ventricular systolic function is normal. Left Atrium: Left atrial size was normal in size. Right Atrium: Right atrial size was normal in size. Pericardium: There is no evidence of pericardial effusion. Mitral Valve: The mitral valve is normal in structure. Trivial mitral valve regurgitation. No evidence of mitral valve stenosis. Tricuspid Valve: The tricuspid valve is normal in structure. Tricuspid valve regurgitation is trivial. No evidence of tricuspid stenosis. Aortic Valve: The aortic valve is tricuspid. Aortic valve regurgitation is not visualized. No aortic stenosis is present. Pulmonic Valve: The pulmonic valve was normal in structure. Pulmonic valve regurgitation is not visualized. No evidence of pulmonic stenosis. Aorta: The aortic root is normal in size and structure. Venous: The inferior vena cava is normal in size with greater than 50% respiratory variability, suggesting right atrial pressure of 3 mmHg. IAS/Shunts: No atrial level shunt detected by color flow Doppler.  LEFT VENTRICLE PLAX 2D LVIDd:         5.10 cm      Diastology LVIDs:         4.10 cm      LV e' medial:    4.73 cm/s LV PW:         1.00 cm      LV E/e' medial:  15.2 LV IVS:        0.90 cm      LV e' lateral:   10.50 cm/s LVOT diam:     2.20 cm      LV E/e' lateral: 6.9 LV SV:         62 LV SV Index:   27 LVOT Area:     3.80 cm  LV Volumes (MOD) LV vol d, MOD A2C: 76.9 ml LV vol d, MOD A4C: 103.0 ml LV vol s, MOD A2C: 32.2 ml LV vol s, MOD A4C: 39.1 ml LV SV MOD A2C:     44.7 ml LV SV MOD A4C:     103.0 ml LV SV MOD BP:      51.7 ml RIGHT VENTRICLE RV S prime:     15.30 cm/s TAPSE (M-mode): 1.8 cm LEFT ATRIUM             Index        RIGHT ATRIUM           Index LA diam:        3.70 cm 1.63 cm/m   RA Area:     17.20 cm LA  Vol (A2C):   45.3 ml 19.99 ml/m  RA Volume:   48.40 ml  21.36 ml/m LA Vol (A4C):   45.2 ml 19.94 ml/m LA Biplane Vol: 46.5 ml 20.52 ml/m  AORTIC VALVE LVOT Vmax:   117.00 cm/s LVOT Vmean:  80.200 cm/s LVOT VTI:    0.163 m  AORTA Ao Root diam: 3.30 cm Ao Asc diam:  3.40 cm MITRAL VALVE MV Area (PHT):  3.93 cm    SHUNTS MV Decel Time: 193 msec    Systemic VTI:  0.16 m MV E velocity: 72.00 cm/s  Systemic Diam: 2.20 cm MV A velocity: 74.10 cm/s MV E/A ratio:  0.97 Olga Millers MD Electronically signed by Olga Millers MD Signature Date/Time: 04/30/2022/11:40:46 AM    Final    MR THORACIC SPINE WO CONTRAST  Addendum Date: 04/28/2022   ADDENDUM REPORT: 04/28/2022 09:41 ADDENDUM: Study discussed by telephone with Dr. Eber Hong on 04/28/2022 at 09:37 . Electronically Signed   By: Odessa Fleming M.D.   On: 04/28/2022 09:41   Result Date: 04/28/2022 CLINICAL DATA:  52 year old male with back pain and drug use. EXAM: MRI THORACIC SPINE WITHOUT CONTRAST TECHNIQUE: Multiplanar, multisequence MR imaging of the thoracic spine was performed. No intravenous contrast was administered. COMPARISON:  CTA chest 12/15/2017. FINDINGS: Limited cervical spine imaging: Nonspecific heterogeneous marrow signal in the lower cervical spine, could be degenerative. Straightening of cervical lordosis. Thoracic spine segmentation: Appears to be normal today and on the prior CTA chest. Alignment: Stable thoracic kyphosis since 2019. No significant spondylolisthesis. Vertebrae: Signal within the disc space is abnormal at T10-T11. There is subtle abnormal T1 and STIR signal in the adjacent vertebral bodies. And there is more conspicuous abnormal epidural material posterior to T10 and surrounding the left facet at that level. See series 22, image 32 which demonstrates subsequent spinal stenosis and mild spinal cord mass effect. Possible mild cord edema there, although the degree of cord compression is mild. There is no overt facet joint fluid or  facet marrow edema. However, there does seem to be developing paraspinal phlegmon there on the left, series 21, image 19) and possible developing paraspinal abscess on series 21, image 2. Superimposed degenerative endplate changes at other lower thoracic levels. Background bone marrow signal is within normal limits. No other convincing marrow edema. Cord: Normal above the T10 level. Subtle prominence of the central spinal CSF canal suspected. Below the T10-T11 disc space the cord signal and morphology also seems to normalize toward the conus which is incompletely visible at L1. Paraspinal and other soft tissues: Abnormal left lateral paraspinal, left costovertebral junction region soft tissues at T10-T11 as above. Right paraspinal soft tissues appear negative. Visible mediastinum and upper abdominal viscera appear negative. There is nonspecific streaky bilateral lower lung opacity greater on the left. No pleural effusion. Disc levels: Ordinary disc degeneration outside of the abnormal T10-T11 level, which does not result in degenerative spinal stenosis. IMPRESSION: 1. Abnormal T10-T11 disc, epidural space, and left paraspinal soft tissues Highly Suspicious For Developing Discitis Osteomyelitis, Epidural And Paraspinal Phlegmon. There may not be a drainable fluid collection at this time, but the epidural involvement results in Spinal Stenosis With Mild Cord Compression and possible mild cord edema there. No pathologic fracture. 2. Thoracic spine degeneration elsewhere not resulting in spinal stenosis. Lumbar MRI reported separately. Electronically Signed: By: Odessa Fleming M.D. On: 04/28/2022 09:30   MR LUMBAR SPINE WO CONTRAST  Result Date: 04/28/2022 CLINICAL DATA:  52 year old male with back pain and drug use. EXAM: MRI LUMBAR SPINE WITHOUT CONTRAST TECHNIQUE: Multiplanar, multisequence MR imaging of the lumbar spine was performed. No intravenous contrast was administered. COMPARISON:  CTA chest 12/15/2017. Thoracic  MRI  Today. FINDINGS: Segmentation:  Normal, concordant with the thoracic numbering today. Alignment: Mild straightening of lumbar lordosis. Mild degenerative appearing retrolisthesis of L5 on S1. No significant scoliosis. Vertebrae: Widespread degenerative endplate spurring. Background bone marrow signal within normal limits. There does appear  to be faint endplate edema at Z6-X0 on the left but this seems to be degenerative in nature. No other convincing marrow edema or evidence of acute osseous abnormality. Intact visible sacrum and SI joints. Conus medullaris and cauda equina: Conus extends to the L1 level. Visible lower thoracic spinal cord and conus signal is normal, the abnormal T10-T11 level is not included on these images (see thoracic MRI). Paraspinal and other soft tissues: Negative. Disc levels: Age advanced although ordinary lumbar spine degeneration resulting in multifactorial mild spinal stenosis L1-L2 through L3-L4, moderate to severe degenerative spinal stenosis at L4-L5 and L5-S1. Associated moderate to severe degenerative neural foraminal stenosis at the bilateral L4 and L5 nerve levels. IMPRESSION: 1. No convincing acute or inflammatory process identified in the lumbar spine. See abnormal Thoracic MRI today reported separately. 2. Widespread degenerative lumbar spinal stenosis, moderate to severe at both L4-L5 and L5-S1, and mild elsewhere. Moderate to severe L4 and L5 neural foraminal stenosis. Study discussed by telephone with Dr. Noemi Chapel on 04/28/2022 at 09:37 . Electronically Signed   By: Genevie Ann M.D.   On: 04/28/2022 09:37   DG Eye Foreign Body  Result Date: 04/28/2022 CLINICAL DATA:  Metal working/exposure; clearance prior to MRI EXAM: ORBITS FOR FOREIGN BODY - 2 VIEW COMPARISON:  None Available. FINDINGS: There is no evidence of metallic foreign body within the orbits. No significant bone abnormality identified. IMPRESSION: No evidence of metallic foreign body within the orbits.  Electronically Signed   By: Emmit Alexanders M.D.   On: 04/28/2022 08:18    Microbiology: Recent Results (from the past 240 hour(s))  Blood culture (routine x 2)     Status: Abnormal   Collection Time: 04/28/22 10:03 AM   Specimen: BLOOD  Result Value Ref Range Status   Specimen Description   Final    BLOOD RIGHT ASSIST CONTROL Performed at [facility], [address], [place], [place] [address]    Special Requests   Final    BOTTLES DRAWN AEROBIC AND ANAEROBIC Blood Culture results may not be optimal due to an excessive volume of blood received in culture bottles Performed at [facility], [address] [place], [place] [address]    Culture  Setup Time   Final    GRAM POSITIVE COCCI BOTTLES DRAWN AEROBIC AND ANAEROBIC Gram Stain Report Called to,Read Back By and Verified With: G. PRUITT@[email or web address]  BY MATTHEWS, B 1.26.24 Performed at [facility], [address], [place], [place] [address]    Culture (A)  Final    STAPHYLOCOCCUS AUREUS SUSCEPTIBILITIES PERFORMED ON PREVIOUS CULTURE WITHIN THE LAST 5 DAYS. Performed at [facility], [place] [address] [place], [place] [address]    Report Status 05/01/2022 FINAL  Final  Blood culture (routine x 2)     Status: Abnormal   Collection Time: 04/28/22 10:05 AM   Specimen: BLOOD  Result Value Ref Range Status   Specimen Description   Final    BLOOD LEFT ASSIST CONTROL Performed at [facility], [address] [place], [place] [address]    Special Requests   Final    BOTTLES DRAWN AEROBIC AND ANAEROBIC Blood Culture adequate volume Performed at [facility], [address] [place], [place] [address]    Culture  Setup Time   Final    GRAM POSITIVE COCCI BOTTLES DRAWN AEROBIC AND ANAEROBIC Gram Stain Report Called to,Read Back By and Verified With: G. PRUITT@2357  BY MATTHEWS, B 1.26.24 CRITICAL RESULT CALLED TO, READ BACK BY AND VERIFIED WITH: PHARMD  G. ABBOTT 04/29/22 @ 0424 BY AB Performed at [facility], [address]  [place], [place] [address]    Culture METHICILLIN RESISTANT STAPHYLOCOCCUS AUREUS (A)  Final   Report Status 05/01/2022 FINAL  Final   Organism ID, Bacteria METHICILLIN RESISTANT STAPHYLOCOCCUS AUREUS  Final      Susceptibility   Methicillin resistant staphylococcus aureus - MIC*    CIPROFLOXACIN <=0.5 SENSITIVE Sensitive     ERYTHROMYCIN >=8 RESISTANT Resistant     GENTAMICIN <=0.5 SENSITIVE Sensitive     OXACILLIN RESISTANT Resistant     TETRACYCLINE <=1 SENSITIVE Sensitive     VANCOMYCIN 1 SENSITIVE Sensitive     TRIMETH/SULFA <=10 SENSITIVE Sensitive     CLINDAMYCIN <=0.25 SENSITIVE Sensitive     RIFAMPIN <=0.5 SENSITIVE Sensitive     Inducible Clindamycin NEGATIVE Sensitive     * METHICILLIN RESISTANT STAPHYLOCOCCUS AUREUS  Blood Culture ID Panel (Reflexed)     Status: Abnormal   Collection Time: 04/28/22 10:05 AM  Result Value Ref Range Status   Enterococcus faecalis NOT DETECTED NOT DETECTED Final   Enterococcus Faecium NOT DETECTED NOT DETECTED Final   Listeria monocytogenes NOT DETECTED NOT DETECTED Final   Staphylococcus species DETECTED (A) NOT DETECTED Final    Comment: CRITICAL RESULT CALLED TO, READ BACK BY AND VERIFIED WITH: PHARMD G. ABBOTT 04/29/22 @ 0424 BY AB    Staphylococcus aureus (BCID) DETECTED (A) NOT DETECTED Final    Comment: Methicillin (oxacillin)-resistant Staphylococcus aureus (MRSA). MRSA is predictably resistant to beta-lactam antibiotics (except ceftaroline). Preferred therapy is vancomycin unless clinically contraindicated. Patient requires contact precautions if  hospitalized. CRITICAL RESULT CALLED TO, READ BACK BY AND VERIFIED WITH: PHARMD G. ABBOTT 04/29/22 @ 0424 BY AB    Staphylococcus epidermidis NOT DETECTED NOT DETECTED Final   Staphylococcus lugdunensis NOT DETECTED NOT DETECTED Final   Streptococcus species NOT DETECTED NOT DETECTED Final   Streptococcus agalactiae NOT DETECTED NOT DETECTED Final   Streptococcus pneumoniae NOT DETECTED NOT  DETECTED Final   Streptococcus pyogenes NOT DETECTED NOT DETECTED Final   A.calcoaceticus-baumannii NOT DETECTED NOT DETECTED Final   Bacteroides fragilis NOT DETECTED NOT DETECTED Final   Enterobacterales NOT DETECTED NOT DETECTED Final   Enterobacter cloacae complex NOT DETECTED NOT DETECTED Final   Escherichia coli NOT DETECTED NOT DETECTED Final   Klebsiella aerogenes NOT DETECTED NOT DETECTED Final   Klebsiella oxytoca NOT DETECTED NOT DETECTED Final   Klebsiella pneumoniae NOT DETECTED NOT DETECTED Final   Proteus species NOT DETECTED NOT DETECTED Final   Salmonella species NOT DETECTED NOT DETECTED Final   Serratia marcescens NOT DETECTED NOT DETECTED Final   Haemophilus influenzae NOT DETECTED NOT DETECTED Final   Neisseria meningitidis NOT DETECTED NOT DETECTED Final   Pseudomonas aeruginosa NOT DETECTED NOT DETECTED Final   Stenotrophomonas maltophilia NOT DETECTED NOT DETECTED Final   Candida albicans NOT DETECTED NOT DETECTED Final   Candida auris NOT DETECTED NOT DETECTED Final   Candida glabrata NOT DETECTED NOT DETECTED Final   Candida krusei NOT DETECTED NOT DETECTED Final   Candida parapsilosis NOT DETECTED NOT DETECTED Final   Candida tropicalis NOT DETECTED NOT DETECTED Final   Cryptococcus neoformans/gattii NOT DETECTED NOT DETECTED Final   Meth resistant mecA/C and MREJ DETECTED (A) NOT DETECTED Final    Comment: CRITICAL RESULT CALLED TO, READ BACK BY AND VERIFIED WITH: PHARMD G. ABBOTT 04/29/22 @ 0424 BY AB Performed at [facility], [address], [place], [place] [address]   Culture, blood (  Routine X 2) w Reflex to ID Panel     Status: Abnormal (Preliminary result)   Collection Time: 04/30/22  7:28 AM   Specimen: BLOOD  Result Value Ref Range Status   Specimen Description BLOOD LEFT ANTECUBITAL  Final   Special Requests   Final    BOTTLES DRAWN AEROBIC AND ANAEROBIC Blood Culture results may not be optimal due to an inadequate volume of blood received  in culture bottles   Culture  Setup Time   Final    GRAM POSITIVE COCCI IN CLUSTERS IN BOTH AEROBIC AND ANAEROBIC BOTTLES Gram Stain Report Called to,Read Back By and Verified With: B. HICKS RN 05/01/22 @ 100 BY AB    Culture (A)  Final    STAPHYLOCOCCUS AUREUS SUSCEPTIBILITIES PERFORMED ON PREVIOUS CULTURE WITHIN THE LAST 5 DAYS. Performed at [facility], [address], [place], [place] [address]    Report Status PENDING  Incomplete  Culture, blood (Routine X 2) w Reflex to ID Panel     Status: Abnormal (Preliminary result)   Collection Time: 04/30/22  7:29 AM   Specimen: BLOOD LEFT HAND  Result Value Ref Range Status   Specimen Description BLOOD LEFT HAND  Final   Special Requests   Final    BOTTLES DRAWN AEROBIC AND ANAEROBIC Blood Culture results may not be optimal due to an inadequate volume of blood received in culture bottles   Culture  Setup Time   Final    GRAM POSITIVE COCCI IN CLUSTERS IN BOTH AEROBIC AND ANAEROBIC BOTTLES Gram Stain Report Called to,Read Back By and Verified With: B HICKS RN 05/01/22 @ 100 BY AB    Culture (A)  Final    STAPHYLOCOCCUS AUREUS SUSCEPTIBILITIES PERFORMED ON PREVIOUS CULTURE WITHIN THE LAST 5 DAYS. Performed at [facility], [address], [place], [place] [address]    Report Status PENDING  Incomplete     Labs: CBC: Recent Labs  Lab 04/28/22 1005 04/29/22 0414 04/30/22 1151  WBC 12.5* 12.9* 14.4*  NEUTROABS 10.3*  --  11.4*  HGB 13.1 13.9 14.0  HCT 39.1 41.6 40.7  MCV 87.7 87.2 85.1  PLT 280 297 303   Basic Metabolic Panel: Recent Labs  Lab 04/28/22 1005 04/29/22 0414 04/30/22 1151  NA 125* 124* 122*  K 4.1 3.9 3.7  CL 89* 85* 85*  CO2 26 28 23   GLUCOSE 99 132* 118*  BUN 26* 15 15  CREATININE 1.08 0.96 0.89  CALCIUM 8.1* 8.1* 8.3*  MG  --  1.8 1.6*   Liver Function Tests: Recent Labs  Lab 04/30/22 1151  AST 49*  ALT 88*  ALKPHOS 190*  BILITOT 0.9  PROT 6.5  ALBUMIN 2.1*   No results for  input(s): "LIPASE", "AMYLASE" in the last 168 hours. Recent Labs  Lab 04/30/22 1151  AMMONIA 33   Cardiac Enzymes: No results for input(s): "CKTOTAL", "CKMB", "CKMBINDEX", "TROPONINI" in the last 168 hours. BNP (last 3 results) Recent Labs    10/19/21 2104  BNP 14.0   CBG: No results for input(s): "GLUCAP" in the last 168 hours.  Signed:  2105  [facility] 05/01/2022, 11:01 AM

## 2022-05-02 LAB — CULTURE, BLOOD (ROUTINE X 2)

## 2022-09-12 ENCOUNTER — Other Ambulatory Visit (HOSPITAL_COMMUNITY): Payer: Self-pay | Admitting: Internal Medicine

## 2022-09-12 DIAGNOSIS — M4644 Discitis, unspecified, thoracic region: Secondary | ICD-10-CM

## 2022-09-12 DIAGNOSIS — M4620 Osteomyelitis of vertebra, site unspecified: Secondary | ICD-10-CM

## 2023-03-30 ENCOUNTER — Encounter (HOSPITAL_COMMUNITY): Payer: Self-pay

## 2023-03-30 ENCOUNTER — Other Ambulatory Visit: Payer: Self-pay

## 2023-03-30 NOTE — Progress Notes (Signed)
PCP - [facility] Cardiologist - [facility] EKG - 02/12/2023 Chest x-ray - 02/06/2023 ECHO - 02/08/2023 Cardiac Cath - Denies  H&P from [facility] dated 03/26/2023.  Hard copy on chart, and also available on Media Tab.  Sleep Study-Denies  Blood Thinner Instructions: Denies Aspirin Instructions: Denies  ERAS Protcol - N/A COVID TEST- N/A  Anesthesia review:  -------------  SDW INSTRUCTIONS:  Your procedure is scheduled on Thursday Jan 2. Please report to [facility] Main Entrance "A" at 0545 A.M., and check in at the Admitting office. Call this number if you have problems the morning of surgery: [phone]   Remember: Do not eat or drink  after midnight the night before your surgery    Medications to take morning of surgery with a sip of water include: TYLENOL  gabapentin 300 MG capsule    As of today, STOP taking any Aspirin (unless otherwise instructed by your surgeon), Aleve, Naproxen, Ibuprofen, Motrin, Advil, Goody's, BC's, all herbal medications, fish oil, and all vitamins.    The Morning of Surgery Do not wear jewelry, make-up or nail polish. Do not wear lotions, powders, or perfumes/colognes, or deodorant Do not bring valuables to the hospital. [facility] is not responsible for any belongings or valuables.  If you are a smoker, DO NOT Smoke 24 hours prior to surgery  If you wear a CPAP at night please bring your mask the morning of surgery   Remember that you must have someone to transport you home after your surgery, and remain with you for 24 hours if you are discharged the same day.  Please bring cases for contacts, glasses, hearing aids, dentures or bridgework because it cannot be worn into surgery.   Patients discharged the day of surgery will not be allowed to drive home.   Please shower the NIGHT BEFORE/MORNING OF SURGERY (use antibacterial soap like DIAL soap if possible). Wear comfortable clothes the morning of surgery. Oral Hygiene is also important  to reduce your risk of infection.  Remember - BRUSH YOUR TEETH THE MORNING OF SURGERY WITH YOUR REGULAR TOOTHPASTE  Patient denies shortness of breath, fever, cough and chest pain.

## 2023-04-05 ENCOUNTER — Ambulatory Visit (HOSPITAL_COMMUNITY): Payer: Medicaid Other

## 2023-04-05 ENCOUNTER — Ambulatory Visit (HOSPITAL_COMMUNITY): Admission: RE | Admit: 2023-04-05 | Payer: Medicaid Other | Source: Home / Self Care

## 2023-04-05 ENCOUNTER — Encounter (HOSPITAL_COMMUNITY): Payer: Self-pay

## 2023-04-05 HISTORY — DX: Essential (primary) hypertension: I10

## 2023-04-05 SURGERY — MRI WITH ANESTHESIA
Anesthesia: General
